# Patient Record
Sex: Male | Born: 1975 | Race: White | Hispanic: No | Marital: Married | Smoking: Never smoker
Health system: Southern US, Community
[De-identification: ages and names within clinical notes are randomized; demographics above are authoritative.]

## PROBLEM LIST (undated history)

## (undated) DIAGNOSIS — N289 Disorder of kidney and ureter, unspecified: Secondary | ICD-10-CM

## (undated) DIAGNOSIS — E669 Obesity, unspecified: Secondary | ICD-10-CM

## (undated) DIAGNOSIS — I10 Essential (primary) hypertension: Secondary | ICD-10-CM

## (undated) HISTORY — DX: Obesity, unspecified: E66.9

---

## 2009-09-22 ENCOUNTER — Ambulatory Visit (HOSPITAL_COMMUNITY): Admission: RE | Admit: 2009-09-22 | Discharge: 2009-09-22 | Payer: Self-pay | Admitting: General Surgery

## 2010-04-24 LAB — DIFFERENTIAL
Basophils Relative: 0 % (ref 0–1)
Eosinophils Absolute: 0.3 10*3/uL (ref 0.0–0.7)
Eosinophils Relative: 3 % (ref 0–5)
Lymphs Abs: 3.1 10*3/uL (ref 0.7–4.0)
Monocytes Relative: 10 % (ref 3–12)

## 2010-04-24 LAB — BASIC METABOLIC PANEL
CO2: 22 mEq/L (ref 19–32)
Chloride: 105 mEq/L (ref 96–112)
Creatinine, Ser: 0.98 mg/dL (ref 0.4–1.5)
GFR calc Af Amer: >60 mL/min (ref >60)
Glucose, Bld: 99 mg/dL (ref 70–99)

## 2010-04-24 LAB — CBC
Hemoglobin: 16 g/dL (ref 13.0–17.0)
MCH: 30.8 pg (ref 26.0–34.0)
MCHC: 34.7 g/dL (ref 30.0–36.0)
MCV: 88.7 fL (ref 78.0–100.0)
Platelets: 216 10*3/uL (ref 150–400)
RBC: 5.2 MIL/uL (ref 4.22–5.81)

## 2010-06-05 ENCOUNTER — Other Ambulatory Visit: Payer: Self-pay | Admitting: Family Medicine

## 2010-06-05 ENCOUNTER — Ambulatory Visit
Admission: RE | Admit: 2010-06-05 | Discharge: 2010-06-05 | Disposition: A | Discharge status: Home / Self Care | Payer: PRIVATE HEALTH INSURANCE | Source: Ambulatory Visit | Attending: Family Medicine | Admitting: Family Medicine

## 2010-06-05 DIAGNOSIS — M25561 Pain in right knee: Secondary | ICD-10-CM

## 2013-12-26 ENCOUNTER — Encounter: Payer: Self-pay | Admitting: Dietician

## 2013-12-26 ENCOUNTER — Encounter: Payer: BC Managed Care – PPO | Attending: Family Medicine | Admitting: Dietician

## 2013-12-26 VITALS — Ht 70.0 in | Wt 275.0 lb

## 2013-12-26 DIAGNOSIS — E669 Obesity, unspecified: Secondary | ICD-10-CM | POA: Diagnosis not present

## 2013-12-26 DIAGNOSIS — Z6839 Body mass index (BMI) 39.0-39.9, adult: Secondary | ICD-10-CM | POA: Diagnosis not present

## 2013-12-26 DIAGNOSIS — Z713 Dietary counseling and surveillance: Secondary | ICD-10-CM | POA: Insufficient documentation

## 2013-12-26 NOTE — Patient Instructions (Addendum)
-  Make healthy choices when picking up something to eat  -Have a protein food with every meal and snack: boiled eggs, nuts and seeds, nut butters, [employer] protein bar, beef jerky  -Breakfast (carb+protein): boiled eggs, peanut butter and jelly sandwich, fruit and nuts  -Stash some snacks at work  -Work on [employer]	[place]   -When cooking, make extras for leftovers  -Steam-in-bag vegetables  -Chicken/egg/tuna salad  -Hard boil several eggs at the beginning of each week  -Drink more water

## 2013-12-26 NOTE — Progress Notes (Signed)
  Medical Nutrition Therapy:  Appt start time: 425 end time:  530.   Assessment:  Primary concerns today: [name] is here today to discuss his diet. 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 He enjoys doing [place] Kwon Do but is not able to go often. He lives with his wife. He does the grocery shopping. His wife has gastroparesis and she follows a low-fiber, bland diet. [place].   Preferred Learning Style:   No preference indicated   Learning Readiness:   contemplating  Ready   MEDICATIONS: see list   DIETARY INTAKE:  Usual eating pattern includes 3 meals and 1-2 snacks per day.  Everyday foods include diet coke.  Avoided foods include milk and all other dairy products.    24-hr recall:  B ( AM): 2 Nutrigrain bars OR 2 [employer] sausage biscuits  Snk ( AM):   L ( PM): peanut butter and jelly sandwich with diet coke Snk ( PM):  D (5 PM): Jimmy John[employer] or pizza or TV dinner Snk (9 PM):   Beverages: diet coke, beer, wine, occasionally liquor  Usual physical activity: tae kwon do, inconsistent  Estimated energy needs: 2000-2200 calories 225-235 g carbohydrates 150-158 g protein 56-58 g fat  Progress Towards Goal(s):  In progress.   Nutritional Diagnosis:  Donald Gates-3.3 Overweight/obesity As related to excessive energy intake, inappropriate food choices, physical inactivity.  As evidenced by BMI 40.    Intervention:  Nutrition counseling provided. Goals: -Make healthy choices when picking up something to eat -Have a protein food with every meal and snack: boiled eggs, nuts and seeds, nut butters, [employer] protein bar, beef jerky -Breakfast (carb+protein): boiled eggs, peanut butter and jelly sandwich, fruit and nuts -Stash some snacks at work -Work on [employer]	[place] meals [place]   -When cooking, make extras for leftovers  -Steam-in-bag vegetables  -Chicken/egg/tuna  salad  -Hard boil several eggs at the beginning of each week -Drink more water  Teaching Method Utilized:  Visual Auditory  Handouts given during visit include:  Fast food options  Healthy fast food options for kids  MyPlate  15g CHO + protein snacks  Barriers to learning/adherence to lifestyle change: schedule  Demonstrated degree of understanding via:  Teach Back   Monitoring/Evaluation:  Dietary intake, exercise, and body weight in 3 month(s).

## 2013-12-27 ENCOUNTER — Encounter: Payer: Self-pay | Admitting: Dietician

## 2014-04-25 ENCOUNTER — Ambulatory Visit: Payer: PRIVATE HEALTH INSURANCE | Admitting: Dietician

## 2014-07-12 ENCOUNTER — Encounter (HOSPITAL_COMMUNITY): Payer: Self-pay | Admitting: Emergency Medicine

## 2014-07-12 ENCOUNTER — Emergency Department (HOSPITAL_COMMUNITY)
Admission: EM | Admit: 2014-07-12 | Discharge: 2014-07-13 | Disposition: A | Discharge status: Home / Self Care | Payer: BC Managed Care – PPO | Attending: Emergency Medicine | Admitting: Emergency Medicine

## 2014-07-12 DIAGNOSIS — I10 Essential (primary) hypertension: Secondary | ICD-10-CM | POA: Insufficient documentation

## 2014-07-12 DIAGNOSIS — R109 Unspecified abdominal pain: Secondary | ICD-10-CM

## 2014-07-12 DIAGNOSIS — E669 Obesity, unspecified: Secondary | ICD-10-CM | POA: Insufficient documentation

## 2014-07-12 DIAGNOSIS — N12 Tubulo-interstitial nephritis, not specified as acute or chronic: Secondary | ICD-10-CM | POA: Insufficient documentation

## 2014-07-12 DIAGNOSIS — Z79899 Other long term (current) drug therapy: Secondary | ICD-10-CM | POA: Insufficient documentation

## 2014-07-12 DIAGNOSIS — G43909 Migraine, unspecified, not intractable, without status migrainosus: Secondary | ICD-10-CM | POA: Insufficient documentation

## 2014-07-12 HISTORY — DX: Disorder of kidney and ureter, unspecified: N28.9

## 2014-07-12 HISTORY — DX: Essential (primary) hypertension: I10

## 2014-07-12 LAB — URINALYSIS, ROUTINE W REFLEX MICROSCOPIC
Bilirubin Urine: NEGATIVE
Glucose, UA: NEGATIVE mg/dL
Ketones, ur: NEGATIVE mg/dL
NITRITE: NEGATIVE
PH: 5.5 (ref 5.0–8.0)
Protein, ur: NEGATIVE mg/dL
Specific Gravity, Urine: 1.017 (ref 1.005–1.030)
Urobilinogen, UA: 0.2 mg/dL (ref 0.0–1.0)

## 2014-07-12 LAB — COMPREHENSIVE METABOLIC PANEL
ALBUMIN: 4.5 g/dL (ref 3.5–5.0)
ALT: 64 U/L — ABNORMAL HIGH (ref 17–63)
AST: 39 U/L (ref 15–41)
Alkaline Phosphatase: 77 U/L (ref 38–126)
Anion gap: 6 (ref 5–15)
BUN: 11 mg/dL (ref 6–20)
CHLORIDE: 106 mmol/L (ref 101–111)
CO2: 21 mmol/L — AB (ref 22–32)
CREATININE: 0.92 mg/dL (ref 0.61–1.24)
Calcium: 8.7 mg/dL — ABNORMAL LOW (ref 8.9–10.3)
Glucose, Bld: 104 mg/dL — ABNORMAL HIGH (ref 65–99)
Potassium: 3.9 mmol/L (ref 3.5–5.1)
Sodium: 133 mmol/L — ABNORMAL LOW (ref 135–145)
TOTAL PROTEIN: 8.1 g/dL (ref 6.5–8.1)
Total Bilirubin: 0.6 mg/dL (ref 0.3–1.2)

## 2014-07-12 LAB — CBC
HEMATOCRIT: 44.2 % (ref 39.0–52.0)
Hemoglobin: 15.2 g/dL (ref 13.0–17.0)
MCH: 30.5 pg (ref 26.0–34.0)
MCHC: 34.4 g/dL (ref 30.0–36.0)
MCV: 88.8 fL (ref 78.0–100.0)
PLATELETS: 248 10*3/uL (ref 150–400)
RBC: 4.98 MIL/uL (ref 4.22–5.81)
RDW: 12.4 % (ref 11.5–15.5)
WBC: 17.1 10*3/uL — ABNORMAL HIGH (ref 4.0–10.5)

## 2014-07-12 LAB — URINE MICROSCOPIC-ADD ON

## 2014-07-12 NOTE — ED Notes (Signed)
Pt c/o R flank pain. Pt has hx of kidney stones. Pt sts "I think I passed a stone earlier today." Pt c/o fever. Pt temp 98.9 at this time. Pt A&Ox4 and ambulatory. Pt c/o urinary frequency. Pt was seen at the walk in clinic and told to come here for rule out of pyelo nephritis. Per clinic, WBC elevated and there was blood in urine.

## 2014-07-13 ENCOUNTER — Emergency Department (HOSPITAL_COMMUNITY): Payer: BC Managed Care – PPO

## 2014-07-13 MED ORDER — DIPHENHYDRAMINE HCL 50 MG/ML IJ SOLN
25.0000 mg | Freq: Once | INTRAMUSCULAR | Status: AC
  Administered 2014-07-13: 25 mg via INTRAVENOUS
  Filled 2014-07-13: qty 1

## 2014-07-13 MED ORDER — KETOROLAC TROMETHAMINE 30 MG/ML IJ SOLN
30.0000 mg | Freq: Once | INTRAMUSCULAR | Status: AC
  Administered 2014-07-13: 30 mg via INTRAVENOUS
  Filled 2014-07-13: qty 1

## 2014-07-13 MED ORDER — HYDROCODONE-ACETAMINOPHEN 5-325 MG PO TABS: 1.0000 | ORAL_TABLET | ORAL | Status: DC | PRN

## 2014-07-13 MED ORDER — ACETAMINOPHEN 325 MG PO TABS
650.0000 mg | ORAL_TABLET | Freq: Once | ORAL | Status: AC
  Administered 2014-07-13: 650 mg via ORAL
  Filled 2014-07-13: qty 2

## 2014-07-13 MED ORDER — DEXTROSE 5 % IV SOLN
1.0000 g | Freq: Once | INTRAVENOUS | Status: AC
  Administered 2014-07-13: 1 g via INTRAVENOUS
  Filled 2014-07-13: qty 10

## 2014-07-13 MED ORDER — CEPHALEXIN 500 MG PO CAPS: 500.0000 mg | ORAL_CAPSULE | Freq: Two times a day (BID) | ORAL | Status: DC

## 2014-07-13 MED ORDER — METOCLOPRAMIDE HCL 5 MG/ML IJ SOLN
10.0000 mg | Freq: Once | INTRAMUSCULAR | Status: AC
  Administered 2014-07-13: 10 mg via INTRAVENOUS
  Filled 2014-07-13: qty 2

## 2014-07-13 MED ORDER — ONDANSETRON 8 MG PO TBDP: 8.0000 mg | ORAL_TABLET | Freq: Three times a day (TID) | ORAL | Status: DC | PRN

## 2014-07-13 NOTE — Discharge Instructions (Signed)
°  Take antibiotics as prescribed.  Take pain and nausea medication as needed.  Return to the ER if you are unable to keep down the antibiotics, if you have continued fever, vomiting despite the medication, or other concerns.  Follow up with your doctor for recheck in 3-5 days.  If you do not have a doctor, follow up with one of the numbers listed    PYELONEPHRITIS  PYELONEPHRITIS: You have been diagnosed with pyelonephritis, also known as a kidney infection.  Pyelonephritis is a bacterial infection in your kidneys. Symptoms include fever, chills, nausea and vomiting, back pain on one or both sides, and sometimes difficulty urinating. You may also feel very weak. The diagnosis is made based on your symptoms and a test of your urine.  Pyelonephritis most commonly occurs when a lower urinary tract infection (bladder infection) ascends (climbs up) the urinary tract and gets into the kidney. Early treatment of a bladder infection is important to prevent pyelonephritis and the possibility of kidney damage.  Pyelonephritis is treated with antibiotics, pain and fever medications, and fluids. Some patients require an "IV" if they have nausea or vomiting and cannot keep down the antibiotics, or if they arent improving after 2-3 days of oral (by mouth) medications.  Most patients do not need to be admitted to the hospital for pyelonephritis.  A few patients who dont do well with the oral (by mouth) antibiotics or become sicker despite treatment may need to return to be admitted to the hospital.  YOU SHOULD SEEK MEDICAL ATTENTION IMMEDIATELY, EITHER HERE OR AT THE NEAREST EMERGENCY DEPARTMENT, IF ANY OF THE FOLLOWING OCCURS:      Failure to improve after 2-3 days of antibiotics.     You develop nausea or vomiting and are unable to keep down medications or fluids.     Increased weakness or lightheadedness.     You develop any progressive or worsening symptoms or any other concerns.        If  you develop symptoms of Shortness of Breath, Chest Pain, Swelling of lips, mouth or tongue or if your condition becomes worse with any new symptoms, see your doctor or return to the Emergency Department for immediate care. Emergency services are not intended to be a substitute for comprehensive medical attention.  Please contact your doctor for follow up if not improving as expected.   Call your doctor in 5-7 days or as directed if there is no improvement.

## 2014-07-13 NOTE — ED Notes (Signed)
Patient transported to CT 

## 2014-07-13 NOTE — ED Provider Notes (Signed)
CSN: [id number]     Arrival date & time 07/12/14  2108 History   First MD Initiated Contact with Patient 07/13/14 0133     Chief Complaint  Patient presents with  . Flank Pain     (Consider location/radiation/quality/duration/timing/severity/associated sxs/prior Treatment) HPI 39 year old male presents to emergency room with complaint of right flank pain, fever, urinary frequency.  Patient had been feeling well, was suddenly became chilled after work today.  Patient urinated and noticed a red colored object in his urine.  He took a long hot shower and then went to the urgent care.  There he was noted to have a fever of 101, and urinalysis concerning for infection.  He was sent here for further evaluation.  Patient reports kidney stone 15 years ago, none since.  He reports that he has had no nausea or vomiting.  His fever has improved. Past Medical History  Diagnosis Date  . Obesity   . Renal disorder   . Hypertension    History reviewed. No pertinent past surgical history. Family History  Problem Relation Age of Onset  . Cancer Mother   . Cancer Father   . Hypertension Other   . Diabetes Other   . Obesity Other   . Heart attack Other    History  Substance Use Topics  . Smoking status: Never Smoker   . Smokeless tobacco: Not on file  . Alcohol Use: Yes    Review of Systems  Genitourinary: Positive for urgency, frequency, hematuria and flank pain.      Allergies  Review of patient's allergies indicates no known allergies.  Home Medications   Prior to Admission medications   Medication Sig Start Date End Date Taking? Authorizing Provider  ibuprofen (ADVIL,MOTRIN) 200 MG tablet Take 400-800 mg by mouth every 6 (six) hours as needed for moderate pain.   Yes Historical Provider, MD  propranolol (INDERAL) 20 MG tablet Take 40 mg by mouth daily.    Yes Historical Provider, MD  cephALEXin (KEFLEX) 500 MG capsule Take 1 capsule (500 mg total) by mouth 2 (two) times daily.  07/13/14   Marisa Severin, MD  HYDROcodone-acetaminophen (NORCO/VICODIN) 5-325 MG per tablet Take 1-2 tablets by mouth every 4 (four) hours as needed. 07/13/14   Marisa Severin, MD  ondansetron (ZOFRAN ODT) 8 MG disintegrating tablet Take 1 tablet (8 mg total) by mouth every 8 (eight) hours as needed for nausea or vomiting. 07/13/14   Marisa Severin, MD   BP 138/75 mmHg  Pulse 76  Temp(Src) 98.9 F (37.2 C) (Oral)  Resp 18  SpO2 98% Physical Exam  Constitutional: He is oriented to person, place, and time. He appears well-developed and well-nourished.  HENT:  Head: Normocephalic and atraumatic.  Nose: Nose normal.  Mouth/Throat: Oropharynx is clear and moist.  Eyes: Conjunctivae and EOM are normal. Pupils are equal, round, and reactive to light.  Neck: Normal range of motion. Neck supple. No JVD present. No tracheal deviation present. No thyromegaly present.  Cardiovascular: Normal rate, regular rhythm, normal heart sounds and intact distal pulses.  Exam reveals no gallop and no friction rub.   No murmur heard. Pulmonary/Chest: Effort normal and breath sounds normal. No stridor. No respiratory distress. He has no wheezes. He has no rales. He exhibits no tenderness.  Abdominal: Soft. Bowel sounds are normal. He exhibits no distension and no mass. There is no tenderness. There is no rebound and no guarding.  No CVA tenderness  Musculoskeletal: Normal range of motion. He exhibits no edema  or tenderness.  Lymphadenopathy:    He has no cervical adenopathy.  Neurological: He is alert and oriented to person, place, and time. He displays normal reflexes. He exhibits normal muscle tone. Coordination normal.  Skin: Skin is warm and dry. No rash noted. No erythema. No pallor.  Psychiatric: He has a normal mood and affect. His behavior is normal. Judgment and thought content normal.  Nursing note and vitals reviewed.   ED Course  Procedures (including critical care time) Labs Review Labs Reviewed  URINALYSIS,  ROUTINE W REFLEX MICROSCOPIC (NOT AT [facility]) - Abnormal; Notable for the following:    APPearance CLOUDY (*)    Hgb urine dipstick MODERATE (*)    Leukocytes, UA MODERATE (*)    All other components within normal limits  CBC - Abnormal; Notable for the following:    WBC 17.1 (*)    All other components within normal limits  COMPREHENSIVE METABOLIC PANEL - Abnormal; Notable for the following:    Sodium 133 (*)    CO2 21 (*)    Glucose, Bld 104 (*)    Calcium 8.7 (*)    ALT 64 (*)    All other components within normal limits  URINE MICROSCOPIC-ADD ON - Abnormal; Notable for the following:    Squamous Epithelial / LPF FEW (*)    Bacteria, UA FEW (*)    All other components within normal limits  URINE CULTURE    Imaging Review Ct Renal Stone Study  07/13/2014   CLINICAL DATA:  Initial valuation for acute right flank pain.  EXAM: CT ABDOMEN AND PELVIS WITHOUT CONTRAST  TECHNIQUE: Multidetector CT imaging of the abdomen and pelvis was performed following the standard protocol without IV contrast.  COMPARISON:  None available.  FINDINGS: Visualized lung bases are clear.  No pleural or pericardial fusion.  Diffuse hypoattenuation of the liver is consistent with steatosis. Focal fatty sparing present adjacent to the gallbladder fossa. Liver is otherwise unremarkable. Gallbladder within normal limits. No biliary dilatation. Spleen, adrenal glands, and pancreas demonstrate a in normal unenhanced appearance.  Right kidney is unremarkable without evidence of nephrolithiasis or hydronephrosis. No radiopaque calculi seen along the course of the right renal collecting system. No right-sided hydroureter.  On the left, there is a punctate 2 mm nonobstructive stone within the interpolar left kidney. No other radiopaque calculi identified. No hydronephrosis. No stones seen along the course of the left renal collecting system. There is no left-sided hydroureter.  Stomach within normal limits. No evidence for bowel  obstruction. Appendix is well visualized in the right lower quadrant and is of normal caliber and appearance without associated inflammatory changes to suggest acute appendicitis. No abnormal wall thickening or inflammatory fat stranding seen about the bowels.  Bladder within normal limits.  Prostate unremarkable.  Small right small fat containing right inguinal hernia noted. No free air or fluid. No pathologically enlarged intra-abdominal or pelvic lymph nodes identified.  No acute osseous abnormality. No worrisome lytic or blastic osseous lesion.  IMPRESSION: 1. No evidence for right-sided nephrolithiasis or obstructive uropathy to explain right-sided flank pain. 2. Punctate 2 mm nonobstructive left renal calculus. No evidence for left-sided obstructive uropathy. 3. No other acute intra-abdominal or pelvic process. 4. Normal appendix. 5. Small fat containing right inguinal hernia. 6. Hepatic steatosis.   Electronically Signed   By: Rise MuBenjamin  McClintock M.D.   On: 07/13/2014 04:19     EKG Interpretation None      MDM   Final diagnoses:  Right flank pain  Pyelonephritis    39 year old male with acute onset of fever and right flank pain.  Concern for infected stone.  Plan for CT abdomen pelvis without contrast.  Patient without but if headache has history of migraines.  We'll give migraine cocktail.  Will start antibodies to cover for pyelonephritis.   Patient's headache has resolved.  No stones seen on the right.  He does have a small stone in the left kidney.currently causing any issues.  Patient has tolerated by mouth fluids here.  He is stable for discharge home.  He was given precautions for return.    Marisa Severin, MD 07/13/14 [id number]

## 2014-07-16 LAB — URINE CULTURE

## 2014-07-17 ENCOUNTER — Telehealth (HOSPITAL_BASED_OUTPATIENT_CLINIC_OR_DEPARTMENT_OTHER): Payer: Self-pay | Admitting: Emergency Medicine

## 2014-07-17 NOTE — Telephone Encounter (Signed)
Post ED Visit - Positive Culture Follow-up  Culture report reviewed by antimicrobial stewardship pharmacist: []  [name], Pharm.D., BCPS []  [name], Pharm.D., BCPS [x]  [name], Pharm.D., BCPS []  [name], [place]arm.D., BCPS, AAHIVP []  [name], Pharm.D., BCPS, AAHIVP []  [name], [address] BoulevardPharm.D., BCPS  Positive urine culture Treated with cephalexin, organism sensitive to the same and no further patient follow-up is required at this time.  [name], [name] 07/17/2014, 9:26 AM

## 2015-09-25 ENCOUNTER — Other Ambulatory Visit: Payer: Self-pay | Admitting: Family Medicine

## 2015-09-25 DIAGNOSIS — R740 Nonspecific elevation of levels of transaminase and lactic acid dehydrogenase [LDH]: Principal | ICD-10-CM

## 2015-09-25 DIAGNOSIS — R7401 Elevation of levels of liver transaminase levels: Secondary | ICD-10-CM

## 2015-10-07 ENCOUNTER — Other Ambulatory Visit: Payer: BC Managed Care – PPO

## 2015-10-14 ENCOUNTER — Other Ambulatory Visit: Payer: BC Managed Care – PPO

## 2015-10-15 ENCOUNTER — Ambulatory Visit
Admission: RE | Admit: 2015-10-15 | Discharge: 2015-10-15 | Disposition: A | Discharge status: Home / Self Care | Payer: BC Managed Care – PPO | Source: Ambulatory Visit | Attending: Family Medicine | Admitting: Family Medicine

## 2015-10-15 DIAGNOSIS — R7401 Elevation of levels of liver transaminase levels: Secondary | ICD-10-CM

## 2015-10-15 DIAGNOSIS — R740 Nonspecific elevation of levels of transaminase and lactic acid dehydrogenase [LDH]: Principal | ICD-10-CM

## 2015-10-17 ENCOUNTER — Other Ambulatory Visit: Payer: BC Managed Care – PPO

## 2016-09-30 ENCOUNTER — Other Ambulatory Visit: Payer: Self-pay | Admitting: Dermatology

## 2016-12-11 ENCOUNTER — Ambulatory Visit (HOSPITAL_COMMUNITY)
Admission: EM | Admit: 2016-12-11 | Discharge: 2016-12-11 | Disposition: A | Discharge status: Home / Self Care | Payer: BC Managed Care – PPO | Attending: Family Medicine | Admitting: Family Medicine

## 2016-12-11 ENCOUNTER — Ambulatory Visit (INDEPENDENT_AMBULATORY_CARE_PROVIDER_SITE_OTHER): Payer: BC Managed Care – PPO

## 2016-12-11 ENCOUNTER — Encounter (HOSPITAL_COMMUNITY): Payer: Self-pay | Admitting: Emergency Medicine

## 2016-12-11 DIAGNOSIS — M549 Dorsalgia, unspecified: Secondary | ICD-10-CM | POA: Diagnosis not present

## 2016-12-11 DIAGNOSIS — W11XXXA Fall on and from ladder, initial encounter: Secondary | ICD-10-CM

## 2016-12-11 MED ORDER — CYCLOBENZAPRINE HCL 10 MG PO TABS: 10.0000 mg | ORAL_TABLET | Freq: Three times a day (TID) | ORAL | 0 refills | Status: DC

## 2016-12-11 MED ORDER — DICLOFENAC SODIUM 75 MG PO TBEC: 75.0000 mg | DELAYED_RELEASE_TABLET | Freq: Two times a day (BID) | ORAL | 0 refills | Status: DC

## 2016-12-11 NOTE — ED Triage Notes (Signed)
Patient fell approx 12 feet off a ladder.  Patient was trimming trees.  Landed on dirt.  No loc.  Patient says he remembers the fall.  Patient says mid back pain, occassionally pain into center chest.  Denies numbness or tingling of extremities.

## 2016-12-11 NOTE — Discharge Instructions (Signed)

## 2016-12-11 NOTE — ED Provider Notes (Signed)
[facility]   [id number] 12/11/16 Arrival Time: 1635  ASSESSMENT & PLAN:  1. Fall from ladder, initial encounter   2. Mid back pain     Meds ordered this encounter  Medications  . cyclobenzaprine (FLEXERIL) 10 MG tablet    Sig: Take 1 tablet (10 mg total) by mouth 3 (three) times daily.    Dispense:  20 tablet    Refill:  0  . diclofenac (VOLTAREN) 75 MG EC tablet    Sig: Take 1 tablet (75 mg total) by mouth 2 (two) times daily.    Dispense:  14 tablet    Refill:  0    Reviewed expectations re: course of current medical issues. Questions answered. Outlined signs and symptoms indicating need for more acute intervention. Patient verbalized understanding. After Visit Summary given.   SUBJECTIVE:  Donald Gates is a 41 y.o. male who presents with complaint of mid back discomfort. Onset abrupt beginning today after he reports falling from a ladder while cutting tree limbs. Questions height of 10-12 feet. "Landed and rolled on the ground." Landed on dirt. Gradual onset of mid back discomfort. Normal breathing without SOB. No n/v. Some discomfort in center of chest; transient and now resolved. No extremity sensation changes or weakness. No specific aggravating or alleviating factors reported. OTC ibuprofen with mild help. Normal bladder habits.  ROS: As per HPI.   OBJECTIVE:  Vitals:   12/11/16 1700  BP: 114/71  Pulse: 69  Resp: 19  Temp: 98.5 F (36.9 C)  TempSrc: Oral  SpO2: 96%    General appearance: alert; no distress Lungs: CTAB Abdomen: soft, non-tender; bowel sounds normal; no masses or organomegaly; no guarding or rebound tenderness Back: poorly localized tenderness over mid and lateral back; FROM at hips Extremities: no cyanosis or edema; symmetrical with no gross deformities Skin: warm and dry Neurologic: normal gait; normal symmetric reflexes; normal LE strength and sensation Psychological: alert and cooperative; normal mood and  affect  Imaging: Dg Chest 1 View  Result Date: 12/11/2016 CLINICAL DATA:  Pt fell off a ladder x 10 foot drop s hrs ago. Busing and abrasions around t6-10 area. Pt denies any breathing pain or rib pain. Pain in T spine increase with ab and adduction. No previous injury to spine, no known lung conditions. Controlled hypertension. EXAM: CHEST 1 VIEW COMPARISON:  None. FINDINGS: Cardiac silhouette is normal in size. No mediastinal or hilar masses or evidence of adenopathy. Clear lungs.  No pleural effusion or pneumothorax. Skeletal structures are intact. IMPRESSION: No active disease. Electronically Signed   By: Amie Portland M.D.   On: 12/11/2016 17:58   Dg Thoracic Spine 2 View  Result Date: 12/11/2016 CLINICAL DATA:  Initial evaluation for acute trauma, fall. EXAM: THORACIC SPINE 2 VIEWS COMPARISON:  None. FINDINGS: There is no evidence of thoracic spine fracture. Alignment is normal. No other significant bone abnormalities are identified. IMPRESSION: No radiographic evidence for acute abnormality within the thoracic spine. Electronically Signed   By: Rise Mu M.D.   On: 12/11/2016 17:59    No Known Allergies  Past Medical History:  Diagnosis Date  . Hypertension   . Obesity   . Renal disorder    Social History   Social History  . Marital status: Married    Spouse name: N/A  . Number of children: N/A  . Years of education: N/A   Occupational History  . Not on file.   Social History Main Topics  . Smoking status: Never Smoker  .  Smokeless tobacco: Not on file  . Alcohol use Yes  . Drug use: Unknown  . Sexual activity: Not on file   Other Topics Concern  . Not on file   Social History Narrative  . No narrative on file   Family History  Problem Relation Age of Onset  . Cancer Mother   . Cancer Father   . Hypertension Other   . Diabetes Other   . Obesity Other   . Heart attack Other    No past surgical history on file.   Mardella LaymanHagler, Paulett Kaufhold, MD 12/13/16  1128

## 2016-12-25 DIAGNOSIS — M542 Cervicalgia: Secondary | ICD-10-CM | POA: Insufficient documentation

## 2017-02-14 DIAGNOSIS — M546 Pain in thoracic spine: Secondary | ICD-10-CM | POA: Insufficient documentation

## 2017-02-14 DIAGNOSIS — M75100 Unspecified rotator cuff tear or rupture of unspecified shoulder, not specified as traumatic: Secondary | ICD-10-CM | POA: Insufficient documentation

## 2017-08-15 DIAGNOSIS — M5136 Other intervertebral disc degeneration, lumbar region: Secondary | ICD-10-CM | POA: Insufficient documentation

## 2018-09-22 DIAGNOSIS — G5603 Carpal tunnel syndrome, bilateral upper limbs: Secondary | ICD-10-CM | POA: Insufficient documentation

## 2019-01-24 ENCOUNTER — Ambulatory Visit: Payer: BC Managed Care – PPO | Attending: Internal Medicine

## 2019-01-24 ENCOUNTER — Other Ambulatory Visit: Payer: Self-pay

## 2019-01-24 DIAGNOSIS — Z20822 Contact with and (suspected) exposure to covid-19: Secondary | ICD-10-CM

## 2019-01-27 LAB — NOVEL CORONAVIRUS, NAA: SARS-CoV-2, NAA: NOT DETECTED

## 2019-02-20 IMAGING — DX DG THORACIC SPINE 2V
2 series · 2 of 2 positions shown · non-contrast
Comparison: None.

CLINICAL DATA: Initial evaluation for acute trauma, fall.

EXAM:
THORACIC SPINE 2 VIEWS

[t-spine ap]
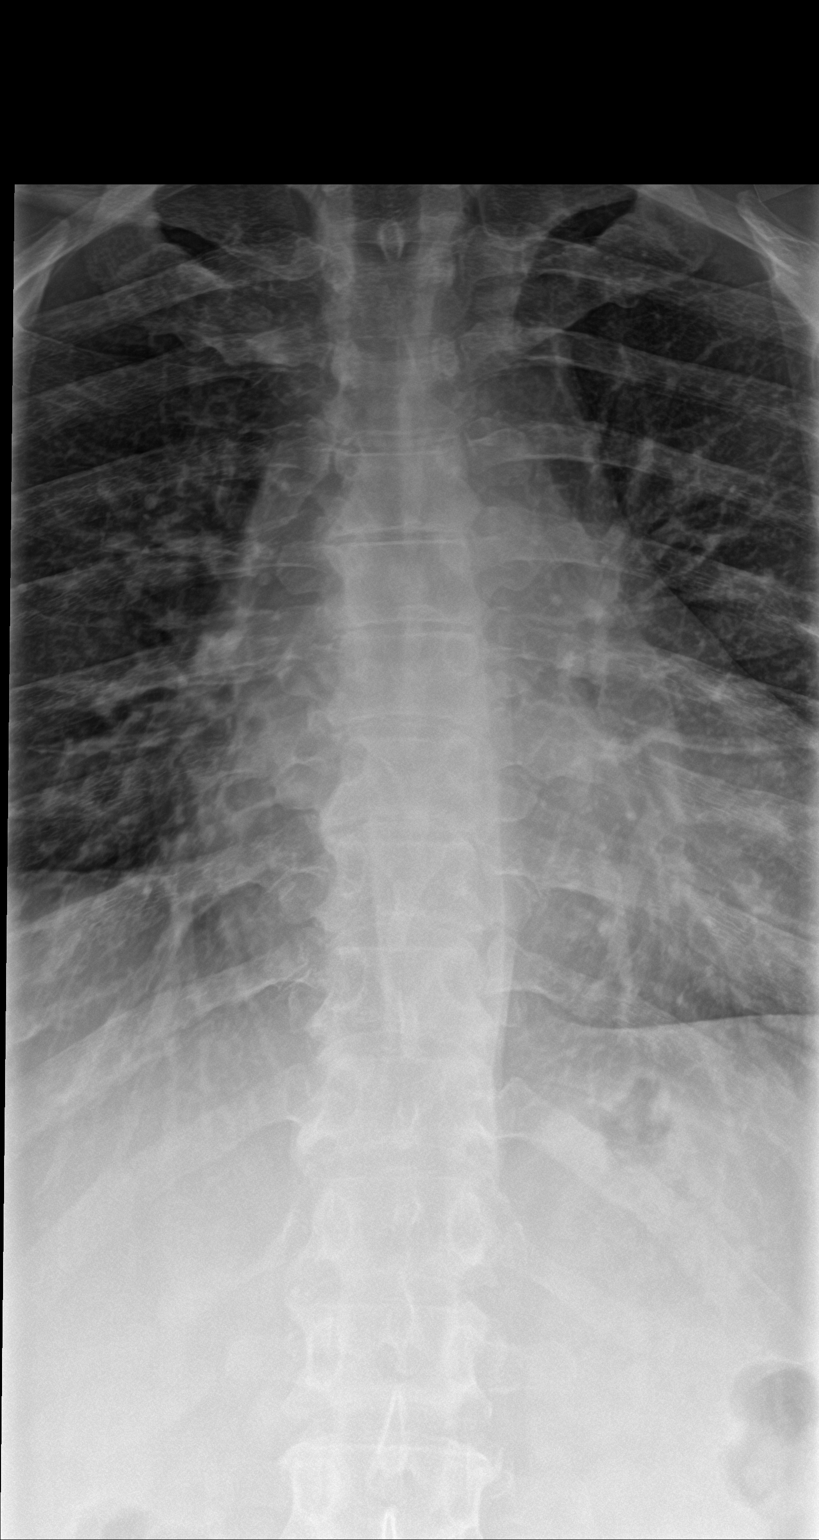

[t-spine lat]
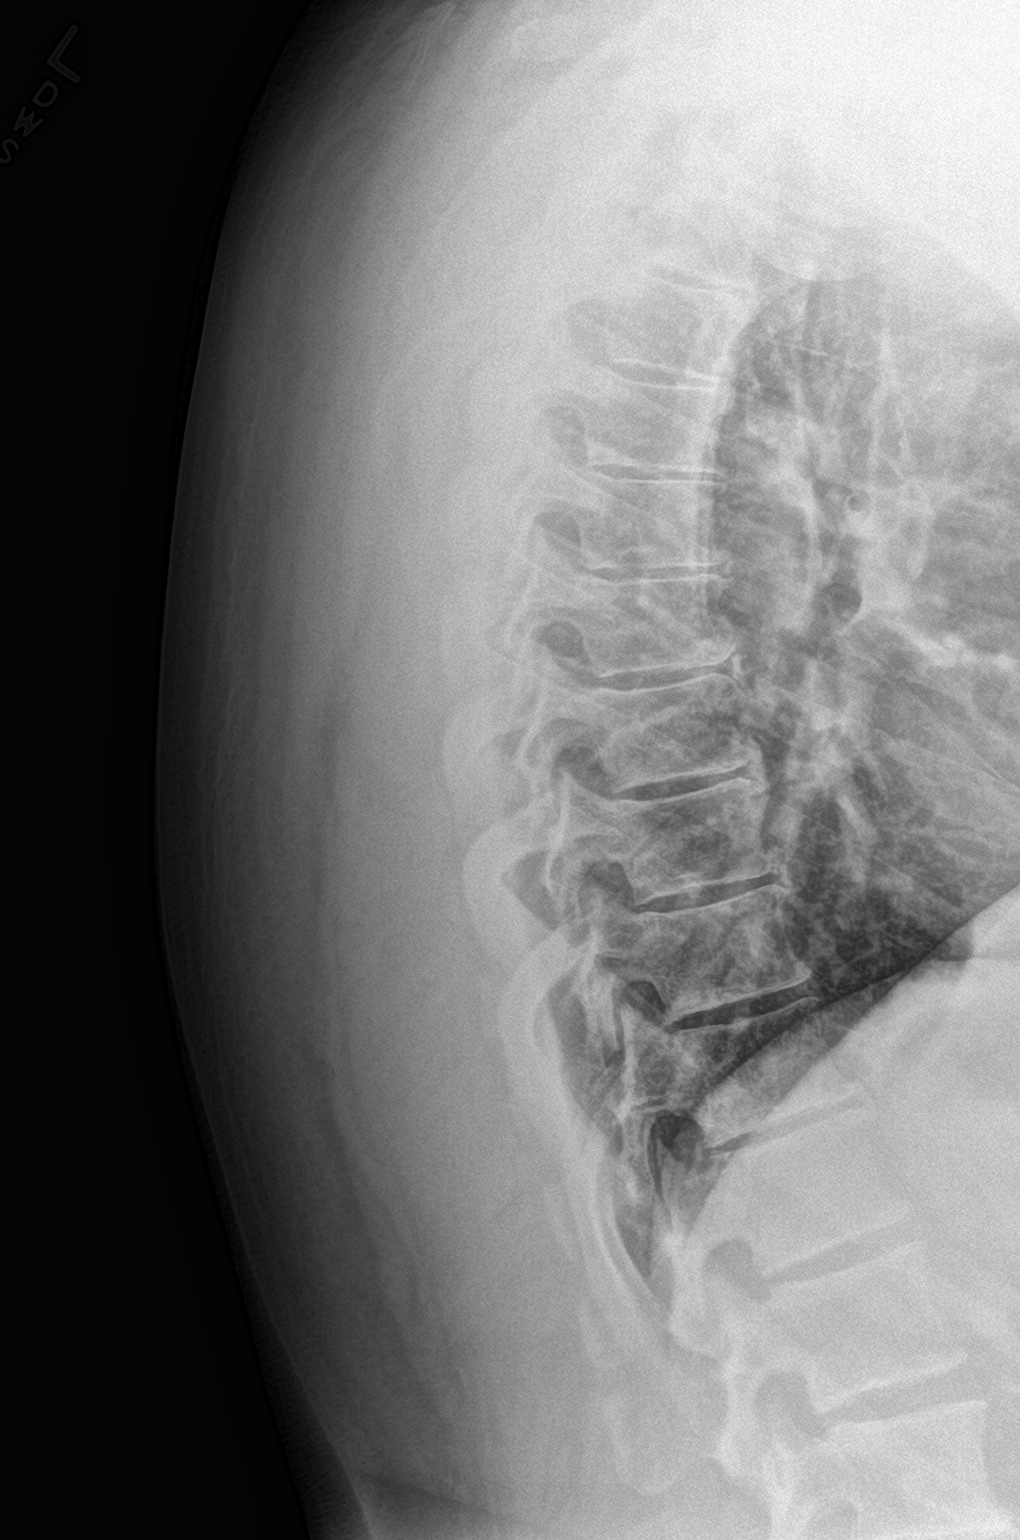

[2 of 2 positions shown; findings below may reference images not displayed]

FINDINGS: There is no evidence of thoracic spine fracture. Alignment is
normal. No other significant bone abnormalities are identified.
IMPRESSION: No radiographic evidence for acute abnormality within the thoracic
spine.

## 2019-04-07 ENCOUNTER — Ambulatory Visit: Payer: BC Managed Care – PPO | Attending: Internal Medicine

## 2019-04-07 DIAGNOSIS — Z23 Encounter for immunization: Secondary | ICD-10-CM

## 2019-04-07 NOTE — Progress Notes (Signed)
   Covid-19 Vaccination Clinic  Name:  Donald Gates    MRN: [id number] DOB: 02-01-1976  04/07/2019  [name] was observed post Covid-19 immunization for 15 minutes without incidence. He was provided with Vaccine Information Sheet and instruction to access the V-Safe system.   [name] was instructed to call 911 with any severe reactions post vaccine: Marland Kitchen Difficulty breathing  . Swelling of your face and throat  . A fast heartbeat  . A bad rash all over your body  . Dizziness and weakness    Immunizations Administered    Name Date Dose VIS Date Route   Pfizer COVID-19 Vaccine 04/07/2019 11:20 AM 0.3 mL 01/19/2019 Intramuscular   Manufacturer: [employer], [employer]   Lot: [id number]   NDC: [address]-4

## 2019-04-28 ENCOUNTER — Ambulatory Visit: Payer: BC Managed Care – PPO | Attending: Internal Medicine

## 2019-04-28 DIAGNOSIS — Z23 Encounter for immunization: Secondary | ICD-10-CM

## 2019-04-28 NOTE — Progress Notes (Signed)
   Covid-19 Vaccination Clinic  Name:  Donald Gates    MRN: [id number] DOB: 15-Apr-1975  04/28/2019  [name] was observed post Covid-19 immunization for 15 minutes without incident. He was provided with Vaccine Information Sheet and instruction to access the V-Safe system.   [name] was instructed to call 911 with any severe reactions post vaccine: Marland Kitchen Difficulty breathing  . Swelling of face and throat  . A fast heartbeat  . A bad rash all over body  . Dizziness and weakness   Immunizations Administered    Name Date Dose VIS Date Route   Pfizer COVID-19 Vaccine 04/28/2019 11:13 AM 0.3 mL 01/19/2019 Intramuscular   Manufacturer: [employer], [employer]   Lot: [id number]   NDC: [address]-9

## 2019-05-02 ENCOUNTER — Ambulatory Visit: Payer: BC Managed Care – PPO

## 2019-08-20 ENCOUNTER — Other Ambulatory Visit: Payer: Self-pay

## 2019-08-20 ENCOUNTER — Encounter: Payer: Self-pay | Admitting: Family Medicine

## 2019-08-20 ENCOUNTER — Ambulatory Visit: Payer: BC Managed Care – PPO | Admitting: Family Medicine

## 2019-08-20 DIAGNOSIS — M542 Cervicalgia: Secondary | ICD-10-CM | POA: Diagnosis not present

## 2019-08-20 DIAGNOSIS — M25512 Pain in left shoulder: Secondary | ICD-10-CM | POA: Diagnosis not present

## 2019-08-20 MED ORDER — MELOXICAM 15 MG PO TABS: 7.5000 mg | ORAL_TABLET | Freq: Every day | ORAL | 6 refills | Status: DC | PRN

## 2019-08-20 MED ORDER — TIZANIDINE HCL 2 MG PO TABS: 2.0000 mg | ORAL_TABLET | Freq: Four times a day (QID) | ORAL | 1 refills | Status: DC | PRN

## 2019-08-20 NOTE — Progress Notes (Signed)
I saw and examined the patient with Dr. Marga Hoots and agree with assessment and plan as outlined.    Left shoulder and arm pain with hand intrinsic muscle weakness, concerning for cervical radiculopathy.  Multiple myofascial trigger points as well.  Will try meds, physical therapy.  Consider nerve studies if symptoms persist.

## 2019-08-20 NOTE — Progress Notes (Signed)
Office Visit Note   Patient: Donald Gates           Date of Birth: 1975-02-20           MRN: [id number] Visit Date: 08/20/2019 Requested by: No referring provider defined for this encounter. PCP: Patient, No Pcp Per  Subjective: Chief Complaint  Patient presents with  . Left Shoulder - Pain    Pain x a few months. Went to [employer] - was not happy with what he was told. NKI. Pain on top of shoulder and down into the deltoid region. Pain with certain movements. Would usually sleep on left side - cannot, due to pain.    HPI: 44yo M presenting with L shoulder pain x 'several months,' which he states is most bothersome with overhead activity and sleeping on his L side. Sm denies any specific injury, though does state he has been 'trying to get back into the gym and get back to doing the right things,' and feels that this injury is holding him back. Pain will occasionally travel up into his L trapezius as well, but does not radiate into his hand. Pain is primarily focused in his L deltoid, though he says it is not particularly painful to the touch. He has tried massage, and has bought both a theragun and a theracane to try to improve his symptoms on his own- with little improvement.  He states he is frustrated as others has told him it'll 'work itself out' and is just isn't going away. He does endorse feeling weaker in his L hand, and occasionally his hand will 'get the tingles,' but states he has been told he has carpal tunnel in the past.               ROS:   All other systems were reviewed and are negative.  Objective: Vital Signs: There were no vitals taken for this visit.  Physical Exam:  General:  Alert and oriented, in no acute distress. Cards: Appears well perfused. Pulm:  Breathing unlabored. Psy:  Normal mood, congruent affect. Skin:  L shoulder with no bruising, swelling, or other deformities.   MSK: LEFT SHOULDER:  No obvious muscular asymmetry. Scapular movement smooth  thorughout ROM. Full AB/ADuction, though does endorse pain in extremes of Abduction.  Empty Can reproduces minimal pain. No pain with full can.   Belly Press: Negative No pain with resisted internal or extremal rotation.  O'Brien/Speeds: Negative Crank: Negative Biceps Load II: Negative No pain with palpation of Biceps tendon. Yergason's Negative.  Sperling negative.  TRIGGER POINTS appreciated in L scalenes, as well as throughout L superior trapezius, rhomboids, and in posterior aspect of deltoid.   NEURO: L hand with reduced grip strength when compared to right. 4/5 strength with finger Abduction (5/5 on right).   Imaging: No results found.  Assessment & Plan: 44yo M with several months of insidious onset L shoulder pain. Examination with several trigger points throughout L upper back/shoulder, neck, as well as with reduced strength in L hand (complicated by Hx of carpal tunnel).  -Recommend physical therapy to help address trigger points, consider cervical exercises - Will prove NSAIDs, muscle relaxants for symptomatic control - If no improvement with plan, RTC in 3-4 wks for consideration of EMG for evaluation of possible cervical radiculopathy as a source of symptoms - Patient had no further questions or concerns.     Procedures: No procedures performed  No notes on file     PMFS History: Patient Active  Problem List   Diagnosis Date Noted  . Bilateral carpal tunnel syndrome 09/22/2018  . Degeneration of lumbar intervertebral disc 08/15/2017  . Pain in thoracic spine 02/14/2017  . Rotator cuff syndrome 02/14/2017  . Neck pain 12/25/2016   Past Medical History:  Diagnosis Date  . Hypertension   . Obesity   . Renal disorder     Family History  Problem Relation Age of Onset  . Cancer Mother   . Cancer Father   . Hypertension Other   . Diabetes Other   . Obesity Other   . Heart attack Other     History reviewed. No pertinent surgical history. Social History    Occupational History  . Not on file  Tobacco Use  . Smoking status: Never Smoker  Substance and Sexual Activity  . Alcohol use: Yes  . Drug use: Not on file  . Sexual activity: Not on file

## 2020-05-14 ENCOUNTER — Ambulatory Visit: Payer: Self-pay

## 2020-05-14 ENCOUNTER — Encounter: Payer: Self-pay | Admitting: Orthopaedic Surgery

## 2020-05-14 ENCOUNTER — Ambulatory Visit: Payer: BC Managed Care – PPO | Admitting: Orthopaedic Surgery

## 2020-05-14 ENCOUNTER — Other Ambulatory Visit: Payer: Self-pay

## 2020-05-14 DIAGNOSIS — M25521 Pain in right elbow: Secondary | ICD-10-CM | POA: Diagnosis not present

## 2020-05-14 NOTE — Progress Notes (Signed)
Office Visit Note   Patient: Donald Gates           Date of Birth: December 10, 1975           MRN: [id number] Visit Date: 05/14/2020              Requested by: No referring provider defined for this encounter. PCP: Patient, No Pcp Per (Inactive)   Assessment & Plan: Visit Diagnoses:  1. Pain in right elbow     Plan: Impression is chronic right elbow lateral epicondylitis.  Given the chronicity of the condition we will need to obtain MRI to rule out structural abnormalities first before we could consider cortisone injection.  Work note was provided today.  Follow-up after the MRI.  Follow-Up Instructions: Return if symptoms worsen or fail to improve.   Orders:  Orders Placed This Encounter  Procedures  . XR Elbow Complete Right (3+View)   No orders of the defined types were placed in this encounter.     Procedures: No procedures performed   Clinical Data: No additional findings.   Subjective: Chief Complaint  Patient presents with  . Right Elbow - Pain    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  Denies any numbness and tingling.  He has been treating this with physical therapy at [facility] and dry needling.  He takes Advil and tizanidine as well.  He feels weakness with grip.  Denies any radiating burning pain.  He states that he may have originally injured it a year ago when he was doing preacher curls at the gym.  He is limited due to the pain.   Review of Systems  Constitutional: Negative.   All other systems reviewed and are negative.    Objective: Vital Signs: There were no vitals taken for this visit.  Physical Exam Vitals and nursing note reviewed.  Constitutional:      Appearance: He is well-developed.  HENT:     Head: Normocephalic and atraumatic.  Eyes:     Pupils: Pupils are equal, round, and reactive to  light.  Pulmonary:     Effort: Pulmonary effort is normal.  Abdominal:     Palpations: Abdomen is soft.  Musculoskeletal:        General: Normal range of motion.     Cervical back: Neck supple.  Skin:    General: Skin is warm.  Neurological:     Mental Status: He is alert and oriented to person, place, and time.  Psychiatric:        Behavior: Behavior normal.        Thought Content: Thought content normal.        Judgment: Judgment normal.     Ortho Exam Right elbow shows full range of motion and full pronation supination.  Has point tenderness over the lateral epicondyle and the common extensor tendon.  Radial head is nontender.  Radial tunnel is nontender.  Negative Tinel at the radial tunnel.  Pain with resisted wrist extension and long finger extension.  Mild pain with passive stretch of the wrist. Specialty Comments:  No specialty comments available.  Imaging: XR Elbow Complete Right (3+View)  Result Date: 05/14/2020 Small cortical irregularity and ossicle near the lateral epicondyle.    PMFS History: Patient Active Problem List   Diagnosis Date Noted  . Bilateral carpal tunnel syndrome 09/22/2018  . Degeneration of  lumbar intervertebral disc 08/15/2017  . Pain in thoracic spine 02/14/2017  . Rotator cuff syndrome 02/14/2017  . Neck pain 12/25/2016   Past Medical History:  Diagnosis Date  . Hypertension   . Obesity   . Renal disorder     Family History  Problem Relation Age of Onset  . Cancer Mother   . Cancer Father   . Hypertension Other   . Diabetes Other   . Obesity Other   . Heart attack Other     History reviewed. No pertinent surgical history. Social History   Occupational History  . Not on file  Tobacco Use  . Smoking status: Never Smoker  . Smokeless tobacco: Not on file  Substance and Sexual Activity  . Alcohol use: Yes  . Drug use: Not on file  . Sexual activity: Not on file

## 2020-05-16 ENCOUNTER — Telehealth: Payer: Self-pay | Admitting: Orthopaedic Surgery

## 2020-05-16 NOTE — Telephone Encounter (Signed)
Called pt 1X left vm to call and set MRI review appt with Dr. Roda Shutters after 05/20/20. Will try back later.

## 2020-05-20 ENCOUNTER — Inpatient Hospital Stay: Admission: RE | Admit: 2020-05-20 | Payer: BC Managed Care – PPO | Source: Ambulatory Visit

## 2020-05-23 ENCOUNTER — Other Ambulatory Visit: Payer: Self-pay

## 2020-05-23 ENCOUNTER — Ambulatory Visit
Admission: RE | Admit: 2020-05-23 | Discharge: 2020-05-23 | Disposition: A | Discharge status: Home / Self Care | Payer: BC Managed Care – PPO | Source: Ambulatory Visit | Attending: Orthopaedic Surgery | Admitting: Orthopaedic Surgery

## 2020-05-23 DIAGNOSIS — M25521 Pain in right elbow: Secondary | ICD-10-CM

## 2020-05-28 ENCOUNTER — Ambulatory Visit: Payer: BC Managed Care – PPO | Admitting: Orthopaedic Surgery

## 2020-05-28 DIAGNOSIS — M7711 Lateral epicondylitis, right elbow: Secondary | ICD-10-CM | POA: Diagnosis not present

## 2020-05-28 NOTE — Progress Notes (Signed)
Office Visit Note   Patient: Flavio Lindroth           Date of Birth: February 23, 1975           MRN: [id number] Visit Date: 05/28/2020              Requested by: No referring provider defined for this encounter. PCP: Patient, No Pcp Per (Inactive)   Assessment & Plan: Visit Diagnoses:  1. Lateral epicondylitis, right elbow     Plan: MRI shows a high-grade partial-thickness tear of the common extensors consistent with chronic lateral epicondylitis and tendinosis.  These findings were independently reviewed and interpreted and discussed with the patient in detail.  Based on these findings my recommendation is for tennis elbow release with arthrotomy and exploration of the elbow joint with debridement as indicated based on intraoperative findings.  Risk benefits rehab recovery time out of work all reviewed with the patient.  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  Questions encouraged and answered.  Follow-Up Instructions: Return if symptoms worsen or fail to improve.   Orders:  No orders of the defined types were placed in this encounter.  No orders of the defined types were placed in this encounter.     Procedures: No procedures performed   Clinical Data: No additional findings.   Subjective: Chief Complaint  Patient presents with  . Right Elbow - Follow-up    MRI review     Karlos is a returning today to for review of the recent right elbow MRI.  Reports no changes to the elbow.   Review of Systems  Constitutional: Negative.   All other systems reviewed and are negative.    Objective: Vital Signs: There were no vitals taken for this visit.  Physical Exam Vitals and nursing note reviewed.  Constitutional:      Appearance: He is well-developed.  Pulmonary:     Effort: Pulmonary effort is  normal.  Abdominal:     Palpations: Abdomen is soft.  Skin:    General: Skin is warm.  Neurological:     Mental Status: He is alert and oriented to person, place, and time.  Psychiatric:        Behavior: Behavior normal.        Thought Content: Thought content normal.        Judgment: Judgment normal.     Ortho Exam Right elbow exam is unchanged. Specialty Comments:  No specialty comments available.  Imaging: No results found.   PMFS History: Patient Active Problem List   Diagnosis Date Noted  . Bilateral carpal tunnel syndrome 09/22/2018  . Degeneration of lumbar intervertebral disc 08/15/2017  . Pain in thoracic spine 02/14/2017  . Rotator cuff syndrome 02/14/2017  . Neck pain 12/25/2016   Past Medical History:  Diagnosis Date  . Hypertension   . Obesity   . Renal disorder     Family History  Problem Relation Age of Onset  . Cancer Mother   . Cancer Father   . Hypertension Other   . Diabetes Other   . Obesity Other   . Heart attack Other     No past surgical history on file. Social History   Occupational History  . Not on file  Tobacco Use  . Smoking status: Never Smoker  . Smokeless tobacco: Not  on file  Substance and Sexual Activity  . Alcohol use: Yes  . Drug use: Not on file  . Sexual activity: Not on file

## 2020-06-17 ENCOUNTER — Other Ambulatory Visit: Payer: Self-pay | Admitting: Physician Assistant

## 2020-06-17 MED ORDER — ONDANSETRON HCL 4 MG PO TABS: 4.0000 mg | ORAL_TABLET | Freq: Three times a day (TID) | ORAL | 0 refills | Status: DC | PRN

## 2020-06-17 MED ORDER — HYDROCODONE-ACETAMINOPHEN 5-325 MG PO TABS: 1.0000 | ORAL_TABLET | Freq: Three times a day (TID) | ORAL | 0 refills | Status: DC | PRN

## 2020-06-19 DIAGNOSIS — M7711 Lateral epicondylitis, right elbow: Secondary | ICD-10-CM | POA: Diagnosis not present

## 2020-06-27 ENCOUNTER — Encounter: Payer: Self-pay | Admitting: Orthopaedic Surgery

## 2020-06-27 ENCOUNTER — Ambulatory Visit (INDEPENDENT_AMBULATORY_CARE_PROVIDER_SITE_OTHER): Payer: BC Managed Care – PPO | Admitting: Physician Assistant

## 2020-06-27 DIAGNOSIS — M7711 Lateral epicondylitis, right elbow: Secondary | ICD-10-CM

## 2020-06-27 NOTE — Progress Notes (Signed)
   Post-Op Visit Note   Patient: Donald Gates           Date of Birth: 1975-11-02           MRN: [id number] Visit Date: 06/27/2020 PCP: Patient, No Pcp Per (Inactive)   Assessment & Plan:  Chief Complaint:  Chief Complaint  Patient presents with  . Right Elbow - Pain   Visit Diagnoses:  1. Lateral epicondylitis, right elbow     Plan: Patient is a pleasant 45 year old gentleman who comes in today 1 week out right elbow extensor release with repair.  He has been doing well.  He has been wearing a removable wrist splint.  Minimal pain.  Examination of his right elbow reveals a well-healed surgical incision without complication.  He does have mild swelling to the incision site.  No evidence of infection or cellulitis.  His fingers are swollen as well but are warm and well-perfused.  At this point, Steri-Strips were applied.  He will continue to elevate the right upper extremity.  Will allow him to work on gentle range of motion of the wrist and elbow.  No strengthening exercises at this point.  He will continue wearing his wrist splint with any lifting but is limited to just a few pounds to the right upper extremity.  He will wear the splint for a total of 6 weeks postop.  He will follow-up with [place] in 3 weeks time for repeat evaluation and transition to formal physical therapy.  Call with concerns or questions in meantime.  Follow-Up Instructions: Return in about 3 weeks (around 07/18/2020).   Orders:  No orders of the defined types were placed in this encounter.  No orders of the defined types were placed in this encounter.   Imaging: No new imaging  PMFS History: Patient Active Problem List   Diagnosis Date Noted  . Lateral epicondylitis, right elbow 06/19/2020  . Bilateral carpal tunnel syndrome 09/22/2018  . Degeneration of lumbar intervertebral disc 08/15/2017  . Pain in thoracic spine 02/14/2017  . Rotator cuff syndrome 02/14/2017  . Neck pain 12/25/2016   Past  Medical History:  Diagnosis Date  . Hypertension   . Obesity   . Renal disorder     Family History  Problem Relation Age of Onset  . Cancer Mother   . Cancer Father   . Hypertension Other   . Diabetes Other   . Obesity Other   . Heart attack Other     History reviewed. No pertinent surgical history. Social History   Occupational History  . Not on file  Tobacco Use  . Smoking status: Never Smoker  . Smokeless tobacco: Not on file  Substance and Sexual Activity  . Alcohol use: Yes  . Drug use: Not on file  . Sexual activity: Not on file

## 2020-07-18 ENCOUNTER — Other Ambulatory Visit: Payer: Self-pay

## 2020-07-18 ENCOUNTER — Ambulatory Visit (INDEPENDENT_AMBULATORY_CARE_PROVIDER_SITE_OTHER): Payer: BC Managed Care – PPO | Admitting: Orthopaedic Surgery

## 2020-07-18 ENCOUNTER — Encounter: Payer: Self-pay | Admitting: Orthopaedic Surgery

## 2020-07-18 DIAGNOSIS — M7711 Lateral epicondylitis, right elbow: Secondary | ICD-10-CM

## 2020-07-18 NOTE — Progress Notes (Signed)
   Post-Op Visit Note   Patient: Donald Gates           Date of Birth: May 24, 1975           MRN: [id number] Visit Date: 07/18/2020 PCP: Patient, No Pcp Per (Inactive)   Assessment & Plan:  Chief Complaint:  Chief Complaint  Patient presents with   Right Elbow - Routine Post Op   Visit Diagnoses:  1. Lateral epicondylitis, right elbow     Plan:   Donald Gates is 4 weeks status post right tennis elbow release and repair.  Overall he is doing better and his pain ranges from 2-5 out of 10 occasionally.  He feels more of a tightness than a true pain.  He takes Tylenol and Advil as needed.  He has been wearing the wrist brace during activity.  Right elbow shows fully healed surgical scar.  Full pronation supination.  No tenderness to palpation of the lateral epicondyle.  He only has very slight restriction in elbow extension and flexion.  Overall I am happy with his progress.  Have given him a prescription for PT to start in a couple weeks at [facility].  He will wear the wrist brace during activity for another couple weeks.  I would like to recheck in 6 weeks.  Follow-Up Instructions: Return in about 6 weeks (around 08/29/2020).   Orders:  No orders of the defined types were placed in this encounter.  No orders of the defined types were placed in this encounter.   Imaging: No results found.  PMFS History: Patient Active Problem List   Diagnosis Date Noted   Lateral epicondylitis, right elbow 06/19/2020   Bilateral carpal tunnel syndrome 09/22/2018   Degeneration of lumbar intervertebral disc 08/15/2017   Pain in thoracic spine 02/14/2017   Rotator cuff syndrome 02/14/2017   Neck pain 12/25/2016   Past Medical History:  Diagnosis Date   Hypertension    Obesity    Renal disorder     Family History  Problem Relation Age of Onset   Cancer Mother    Cancer Father    Hypertension Other    Diabetes Other    Obesity Other    Heart attack Other     History  reviewed. No pertinent surgical history. Social History   Occupational History   Not on file  Tobacco Use   Smoking status: Never   Smokeless tobacco: Not on file  Substance and Sexual Activity   Alcohol use: Yes   Drug use: Not on file   Sexual activity: Not on file

## 2020-08-29 ENCOUNTER — Ambulatory Visit (INDEPENDENT_AMBULATORY_CARE_PROVIDER_SITE_OTHER): Payer: BC Managed Care – PPO | Admitting: Physician Assistant

## 2020-08-29 ENCOUNTER — Other Ambulatory Visit: Payer: Self-pay

## 2020-08-29 ENCOUNTER — Encounter: Payer: Self-pay | Admitting: Orthopaedic Surgery

## 2020-08-29 VITALS — Ht 70.0 in | Wt 275.0 lb

## 2020-08-29 DIAGNOSIS — M7711 Lateral epicondylitis, right elbow: Secondary | ICD-10-CM

## 2020-08-29 NOTE — Progress Notes (Signed)
   Post-Op Visit Note   Patient: Donald Gates           Date of Birth: 28-Mar-1975           MRN: [id number] Visit Date: 08/29/2020 PCP: Patient, No Pcp Per (Inactive)   Assessment & Plan:  Chief Complaint:  Chief Complaint  Patient presents with   Right Elbow - Follow-up    Right tennis elbow release 06/19/2020   Visit Diagnoses:  1. Lateral epicondylitis, right elbow     Plan: Patient is a pleasant 45 year old gentleman who comes in today 10 weeks out right tennis elbow release and repair, date of surgery 06/19/2020.  He has been doing well.  He has not been wearing his brace.  He has minimal to no pain.  He just started physical therapy and has been to 2 sessions.  Overall, he feels as though he is 75% improved.  Examination of his right elbow reveals a fully healed surgical scar without complication.  Mild swelling.  No tenderness.  He has near full range of motion but still lacks approximately 10 degrees of flexion.  4 out of 5 strength.  He is neurovascular intact distally.  At this point, he will continue with physical therapy to obtain the rest of his flexion and improve his strength.  We will wear the brace as needed with activity.  Follow-up with [place] in 6 weeks time for final check.  Call with concerns or questions.  Follow-Up Instructions: Return in about 6 weeks (around 10/10/2020).   Orders:  No orders of the defined types were placed in this encounter.  No orders of the defined types were placed in this encounter.   Imaging: No new imaging  PMFS History: Patient Active Problem List   Diagnosis Date Noted   Lateral epicondylitis, right elbow 06/19/2020   Bilateral carpal tunnel syndrome 09/22/2018   Degeneration of lumbar intervertebral disc 08/15/2017   Pain in thoracic spine 02/14/2017   Rotator cuff syndrome 02/14/2017   Neck pain 12/25/2016   Past Medical History:  Diagnosis Date   Hypertension    Obesity    Renal disorder     Family History   Problem Relation Age of Onset   Cancer Mother    Cancer Father    Hypertension Other    Diabetes Other    Obesity Other    Heart attack Other     History reviewed. No pertinent surgical history. Social History   Occupational History   Not on file  Tobacco Use   Smoking status: Never   Smokeless tobacco: Not on file  Substance and Sexual Activity   Alcohol use: Yes   Drug use: Not on file   Sexual activity: Not on file

## 2020-10-09 ENCOUNTER — Other Ambulatory Visit: Payer: Self-pay

## 2020-10-09 ENCOUNTER — Ambulatory Visit: Payer: BC Managed Care – PPO | Admitting: Orthopaedic Surgery

## 2020-10-09 ENCOUNTER — Encounter: Payer: Self-pay | Admitting: Orthopaedic Surgery

## 2020-10-09 VITALS — Ht 70.0 in | Wt 275.0 lb

## 2020-10-09 DIAGNOSIS — M25521 Pain in right elbow: Secondary | ICD-10-CM | POA: Diagnosis not present

## 2020-10-09 NOTE — Progress Notes (Signed)
   Post-Op Visit Note   Patient: Donald Gates           Date of Birth: 12-11-1975           MRN: [id number] Visit Date: 10/09/2020 PCP: Patient, No Pcp Per (Inactive)   Assessment & Plan:  Chief Complaint:  Chief Complaint  Patient presents with   Right Elbow - Follow-up    Right tennis elbow release 06/19/2020   Visit Diagnoses:  1. Pain in right elbow     Plan: Patient is a pleasant 45 year old gentleman who comes in today approximately 4 months status post right tennis elbow release, date of surgery 06/19/2020.  He has been doing well.  He has returned to work without any issues.  He has regained full range of motion.  He still working on strengthening exercises.  He did have a gap in therapy several weeks ago as he was recovering from COVID.  Examination of his right elbow reveals full range of motion.  4 out of 5 strength.  He is neurovascular intact distally.  At this point, he will continue to work on strengthening exercises.  We will follow-up with [place] as needed.  Follow-Up Instructions: Return if symptoms worsen or fail to improve.   Orders:  No orders of the defined types were placed in this encounter.  No orders of the defined types were placed in this encounter.   Imaging: No new imaging  PMFS History: Patient Active Problem List   Diagnosis Date Noted   Lateral epicondylitis, right elbow 06/19/2020   Bilateral carpal tunnel syndrome 09/22/2018   Degeneration of lumbar intervertebral disc 08/15/2017   Pain in thoracic spine 02/14/2017   Rotator cuff syndrome 02/14/2017   Neck pain 12/25/2016   Past Medical History:  Diagnosis Date   Hypertension    Obesity    Renal disorder     Family History  Problem Relation Age of Onset   Cancer Mother    Cancer Father    Hypertension Other    Diabetes Other    Obesity Other    Heart attack Other     History reviewed. No pertinent surgical history. Social History   Occupational History   Not on file   Tobacco Use   Smoking status: Never   Smokeless tobacco: Not on file  Substance and Sexual Activity   Alcohol use: Yes   Drug use: Not on file   Sexual activity: Not on file

## 2020-12-16 ENCOUNTER — Ambulatory Visit: Payer: BC Managed Care – PPO | Admitting: Orthopaedic Surgery

## 2020-12-16 ENCOUNTER — Other Ambulatory Visit: Payer: Self-pay

## 2020-12-16 ENCOUNTER — Ambulatory Visit: Payer: Self-pay

## 2020-12-16 ENCOUNTER — Encounter: Payer: Self-pay | Admitting: Orthopaedic Surgery

## 2020-12-16 DIAGNOSIS — M25561 Pain in right knee: Secondary | ICD-10-CM | POA: Diagnosis not present

## 2020-12-16 DIAGNOSIS — M25562 Pain in left knee: Secondary | ICD-10-CM

## 2020-12-16 DIAGNOSIS — G8929 Other chronic pain: Secondary | ICD-10-CM

## 2020-12-16 NOTE — Progress Notes (Signed)
Office Visit Note   Patient: Donald Gates           Date of Birth: [birth year]           MRN: [id number] Visit Date: 12/16/2020              Requested by: No referring provider defined for this encounter. PCP: Patient, No Pcp Per (Inactive)   Assessment & Plan: Visit Diagnoses:  1. Chronic pain of both knees     Plan: Impression is bilateral knee pain.  He may have patella tendinitis and some inflammation of the fat pad which is often associated with patellofemoral dysfunction.  I recommended that he rest for a month or until it feels better before engaging in leg presses.  He is going to pick up some Voltaren gel to try.  He may want to consider kinesiotaping or physical therapy.  Follow-Up Instructions: No follow-ups on file.   Orders:  Orders Placed This Encounter  Procedures   XR KNEE 3 VIEW RIGHT   XR KNEE 3 VIEW LEFT   No orders of the defined types were placed in this encounter.     Procedures: No procedures performed   Clinical Data: No additional findings.   Subjective: Chief Complaint  Patient presents with   Right Knee - Pain   Left Knee - Pain    Donald Gates comes in for bilateral knee pain with dull aching that is getting worse over time.  He feels limited in what he can do because of it.  He feels popping cracking and burning.  Takes Advil 600 mg.  He has been doing a lot of squats for exercise.  He had a cortisone injection a long time ago and cannot remember if this was helpful.  The pain is mainly limited to the anterior aspect of the knee.   Review of Systems  Constitutional: Negative.   All other systems reviewed and are negative.   Objective: Vital Signs: There were no vitals taken for this visit.  Physical Exam Vitals and nursing note reviewed.  Constitutional:      Appearance: He is well-developed.  Pulmonary:     Effort: Pulmonary effort is normal.  Abdominal:     Palpations: Abdomen is soft.  Skin:    General: Skin is  warm.  Neurological:     Mental Status: He is alert and oriented to person, place, and time.  Psychiatric:        Behavior: Behavior normal.        Thought Content: Thought content normal.        Judgment: Judgment normal.    Ortho Exam  Bilateral knees show mild patellofemoral crepitus with range of motion.  Collaterals and cruciates are stable.  Strength is intact.  No joint line tenderness.  The inferior pole the patella is nontender and the patella tendon is nontender.  Specialty Comments:  No specialty comments available.  Imaging: XR KNEE 3 VIEW LEFT  Result Date: 12/16/2020 No acute or structural abnormalities  XR KNEE 3 VIEW RIGHT  Result Date: 12/16/2020 No acute or structural abnormalities    PMFS History: Patient Active Problem List   Diagnosis Date Noted   Lateral epicondylitis, right elbow 06/19/2020   Bilateral carpal tunnel syndrome 09/22/2018   Degeneration of lumbar intervertebral disc 08/15/2017   Pain in thoracic spine 02/14/2017   Rotator cuff syndrome 02/14/2017   Neck pain 12/25/2016   Past Medical History:  Diagnosis Date   Hypertension  Obesity    Renal disorder     Family History  Problem Relation Age of Onset   Cancer Mother    Cancer Father    Hypertension Other    Diabetes Other    Obesity Other    Heart attack Other     History reviewed. No pertinent surgical history. Social History   Occupational History   Not on file  Tobacco Use   Smoking status: Never   Smokeless tobacco: Not on file  Substance and Sexual Activity   Alcohol use: Yes   Drug use: Not on file   Sexual activity: Not on file

## 2021-04-28 ENCOUNTER — Other Ambulatory Visit: Payer: Self-pay

## 2021-04-28 ENCOUNTER — Ambulatory Visit (INDEPENDENT_AMBULATORY_CARE_PROVIDER_SITE_OTHER): Payer: BC Managed Care – PPO

## 2021-04-28 ENCOUNTER — Ambulatory Visit: Payer: BC Managed Care – PPO | Admitting: Orthopaedic Surgery

## 2021-04-28 DIAGNOSIS — M25512 Pain in left shoulder: Secondary | ICD-10-CM | POA: Diagnosis not present

## 2021-04-28 DIAGNOSIS — G8929 Other chronic pain: Secondary | ICD-10-CM

## 2021-04-28 DIAGNOSIS — M25551 Pain in right hip: Secondary | ICD-10-CM

## 2021-04-28 MED ORDER — PREDNISONE 10 MG (21) PO TBPK: ORAL_TABLET | ORAL | 0 refills | Status: DC

## 2021-04-28 MED ORDER — METHOCARBAMOL 500 MG PO TABS: 500.0000 mg | ORAL_TABLET | Freq: Two times a day (BID) | ORAL | 0 refills | Status: DC | PRN

## 2021-04-28 NOTE — Progress Notes (Signed)
? ?Office Visit Note ?  ?Patient: Donald Gates           ?Date of Birth: Dec 07, 1975           ?MRN: [id number] ?Visit Date: 04/28/2021 ?             ?Requested by: No referring provider defined for this encounter. ?PCP: Patient, No Pcp Per (Inactive) ? ? ?Assessment & Plan: ?Visit Diagnoses:  ?1. Pain in right hip   ?2. Chronic left shoulder pain   ? ? ?Plan: Impression is right hip early osteoarthritis and left upper extremity cervical spine radiculopathy.  In review regards to the right hip, would like to refer the patient to Dr. Ernestina Patches for fluoroscopic guided cortisone injection.  In regards to the left upper extremity radiculopathy, have started the patient on a steroid taper in addition to a muscle relaxer.  I provided him with a hard copy prescription for physical therapy.  If his symptoms do not improve over the next 6 to 8 weeks he will call and we will get an MRI of the neck.  Otherwise, follow-up with [place] as needed. ? ?Follow-Up Instructions: Return if symptoms worsen or fail to improve.  ? ?Orders:  ?Orders Placed This Encounter  ?Procedures  ? XR HIP UNILAT W OR W/O PELVIS 1V RIGHT  ? XR Shoulder Left  ? XR Cervical Spine 2 or 3 views  ? ?Meds ordered this encounter  ?Medications  ? predniSONE (STERAPRED UNI-PAK 21 TAB) 10 MG (21) TBPK tablet  ?  Sig: Take as directed  ?  Dispense:  21 tablet  ?  Refill:  0  ? methocarbamol (ROBAXIN) 500 MG tablet  ?  Sig: Take 1 tablet (500 mg total) by mouth 2 (two) times daily as needed.  ?  Dispense:  20 tablet  ?  Refill:  0  ? ? ? ? Procedures: ?No procedures performed ? ? ?Clinical Data: ?No additional findings. ? ? ?Subjective: ?Chief Complaint  ?Patient presents with  ? Right Hip - Pain  ? Left Shoulder - Pain  ? ? ?HPI patient is a pleasant 46 year old gentleman who comes in today with right hip and left shoulder pain.  In regards to the hip, he has had pain primarily to the lateral aspect with associated spasms to the groin.  He denies any injury or  change in activity.  Symptoms appear to be worse when he has been walking for 15 or 20 minutes or longer.  He denies any paresthesias to the right lower extremity.  In regards to the left shoulder, the pain is actually to the lateral neck and radiates into the parascapular region as well as down the entire arm and into the fingers where he gets tingling sensations primarily to the long, ring and small fingers.  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  He denies any history of neck or shoulder pathology. ? ?Review of Systems as detailed in HPI.  All others reviewed and are negative. ? ? ?Objective: ?Vital Signs: There were no vitals taken for this visit. ? ?Physical Exam well-developed well-nourished gentleman in no acute distress.  Alert and oriented x3. ? ?Ortho Exam right hip exam reveals a positive logroll.  Does have pain to the groin with resisted hip flexion.  No pain to the lateral hip.  Left shoulder exam reveals full active range of motion in all planes.  Full strength throughout.  Cervical  spine exam shows no spinous or paraspinous tenderness.  Does have moderate tenderness along the parascapular region.  He is neurovascular intact distally.  No focal weakness. ? ?Specialty Comments:  ?No specialty comments available. ? ?Imaging: ?XR HIP UNILAT W OR W/O PELVIS 1V RIGHT ? ?Result Date: 04/28/2021 ?Mild degenerative changes with osteophyte formation ? ?XR Cervical Spine 2 or 3 views ? ?Result Date: 04/28/2021 ?Moderate multilevel spondylosis with straightening of the cervical spine ? ?XR Shoulder Left ? ?Result Date: 04/28/2021 ?Mild degenerative changes of the [facility] joint  ? ? ?PMFS History: ?Patient Active Problem List  ? Diagnosis Date Noted  ? Lateral epicondylitis, right elbow 06/19/2020  ? Bilateral carpal tunnel syndrome 09/22/2018  ? Degeneration of lumbar intervertebral disc 08/15/2017  ? Pain in thoracic spine 02/14/2017  ? Rotator cuff syndrome 02/14/2017  ?  Neck pain 12/25/2016  ? ?Past Medical History:  ?Diagnosis Date  ? Hypertension   ? Obesity   ? Renal disorder   ?  ?Family History  ?Problem Relation Age of Onset  ? Cancer Mother   ? Cancer Father   ? Hypertension Other   ? Diabetes Other   ? Obesity Other   ? Heart attack Other   ?  ?No past surgical history on file. ?Social History  ? ?Occupational History  ? Not on file  ?Tobacco Use  ? Smoking status: Never  ? Smokeless tobacco: Not on file  ?Substance and Sexual Activity  ? Alcohol use: Yes  ? Drug use: Not on file  ? Sexual activity: Not on file  ? ? ? ? ? ? ?

## 2021-04-29 ENCOUNTER — Telehealth: Payer: Self-pay | Admitting: Orthopaedic Surgery

## 2021-04-29 NOTE — Telephone Encounter (Signed)
Pt called requesting a refill of hydrocodone. Pt states his pharmacy is out of them and asking for it to be sent to [facility] on [address]. Please call pt about this matter at [phone]. ?

## 2021-04-30 ENCOUNTER — Telehealth: Payer: Self-pay | Admitting: Physician Assistant

## 2021-04-30 ENCOUNTER — Other Ambulatory Visit: Payer: Self-pay | Admitting: Physician Assistant

## 2021-04-30 NOTE — Telephone Encounter (Signed)
Called patient no answer LMOM. Just need to advise on message below.  

## 2021-04-30 NOTE — Telephone Encounter (Signed)
Patient asked if the Rx can be sent in for Tramadol. Patient asked if the Rx can be sent to [facility] on [address]?  ?The number to contact patient is [phone] ?

## 2021-04-30 NOTE — Telephone Encounter (Signed)
We cannot refill norco, but I can send in tramadol or tylenol 3

## 2021-05-01 ENCOUNTER — Other Ambulatory Visit: Payer: Self-pay | Admitting: Physician Assistant

## 2021-05-01 MED ORDER — TRAMADOL HCL 50 MG PO TABS: 50.0000 mg | ORAL_TABLET | Freq: Two times a day (BID) | ORAL | 2 refills | Status: DC | PRN

## 2021-05-01 NOTE — Telephone Encounter (Signed)
Pharmacy is waiting for authorization for the pt medication  ? ?If there is a problem please call the pt  ?

## 2021-05-01 NOTE — Telephone Encounter (Signed)
I left voicemail for patient advising. 

## 2021-05-01 NOTE — Telephone Encounter (Signed)
sent 

## 2021-05-01 NOTE — Telephone Encounter (Signed)
I called pharmacy. Submitted to Cover My Meds. Patient called and advised. ? ?Demaris Callander Key: [id number] help? Call us at [phone] ?Status ?Sent to [employer] ?Drug ?traMADol HCl 50MG  tablets ?Form ?[employer] Electronic Request Form (CB ?

## 2021-05-07 NOTE — Telephone Encounter (Signed)
Pt is calling to find out the status of the medication --Tramadol ? ?Please call the pt  ?

## 2021-05-08 ENCOUNTER — Other Ambulatory Visit: Payer: Self-pay | Admitting: Physician Assistant

## 2021-05-08 MED ORDER — ACETAMINOPHEN-CODEINE #3 300-30 MG PO TABS: 1.0000 | ORAL_TABLET | Freq: Two times a day (BID) | ORAL | 0 refills | Status: DC | PRN

## 2021-05-08 NOTE — Telephone Encounter (Signed)
Sent in tylenol 3

## 2021-05-08 NOTE — Telephone Encounter (Signed)
Patient aware.

## 2021-06-08 ENCOUNTER — Ambulatory Visit: Payer: Self-pay

## 2021-06-08 ENCOUNTER — Ambulatory Visit: Payer: BC Managed Care – PPO | Admitting: Physical Medicine and Rehabilitation

## 2021-06-08 DIAGNOSIS — M25551 Pain in right hip: Secondary | ICD-10-CM | POA: Diagnosis not present

## 2021-06-08 NOTE — Progress Notes (Signed)
? ?  Drayke Grabel - 46 y.o. male MRN [id number]  Date of birth: 12/30/1975 ? ?Office Visit Note: ?Visit Date: 06/08/2021 ?PCP: Patient, No Pcp Per (Inactive) ?Referred by: Tarry Kos, MD ? ?Subjective: ?Chief Complaint  ?Patient presents with  ? Right Hip - Pain  ? ?HPI:  Vadhir Mcnay is a 46 y.o. male who comes in today at the request of Dr. Glee Arvin for planned Right anesthetic hip arthrogram with fluoroscopic guidance.  The patient has failed conservative care including home exercise, medications, time and activity modification.  This injection will be diagnostic and hopefully therapeutic.  Please see requesting physician notes for further details and justification.  ? ? ?ROS Otherwise per HPI. ? ?Assessment & Plan: ?Visit Diagnoses:  ?  ICD-10-CM   ?1. Pain in right hip  M25.551 Large Joint Inj: R hip joint  ?  XR C-ARM NO REPORT  ?  ?  ?Plan: No additional findings.  ? ?Meds & Orders: No orders of the defined types were placed in this encounter. ?  ?Orders Placed This Encounter  ?Procedures  ? Large Joint Inj: R hip joint  ? XR C-ARM NO REPORT  ?  ?Follow-up: Return for visit to requesting provider as needed.  ? ?Procedures: ?Large Joint Inj: R hip joint on 06/08/2021 1:23 PM ?Indications: diagnostic evaluation and pain ?Details: 22 G 3.5 in needle, fluoroscopy-guided anterior approach ? ?Arthrogram: No ? ?Medications: 4 mL bupivacaine 0.25 %; 60 mg triamcinolone acetonide 40 MG/ML ?Outcome: tolerated well, no immediate complications ? ?There was excellent flow of contrast producing a partial arthrogram of the hip. The patient did have relief of symptoms during the anesthetic phase of the injection. ?Procedure, treatment alternatives, risks and benefits explained, specific risks discussed. Consent was given by the patient. Immediately prior to procedure a time out was called to verify the correct patient, procedure, equipment, support staff and site/side marked as required. Patient was prepped and  draped in the usual sterile fashion.  ? ?  ?   ? ?Clinical History: ?No specialty comments available.  ? ? ? ?Objective:  VS:  HT:    WT:   BMI:     BP:   HR: bpm  TEMP: ( )  RESP:  ?Physical Exam  ? ?Imaging: ?No results found. ?

## 2021-06-08 NOTE — Progress Notes (Signed)
Pt state right hip pain. Pt state waling and standing makes the pain worse. Pt state he takes over the counter pain meds to help ease his pain. ? ?Numeric Pain Rating Scale and Functional Assessment ?Average Pain 2 ? ? ?In the last MONTH (on 0-10 scale) has pain interfered with the following? ? ?1. General activity like being  able to carry out your everyday physical activities such as walking, climbing stairs, carrying groceries, or moving a chair?  ?Rating(8) ? ? ? -BT, -Dye Allergies. ? ?

## 2021-06-18 MED ORDER — BUPIVACAINE HCL 0.25 % IJ SOLN
4.0000 mL | INTRAMUSCULAR | Status: AC | PRN
  Administered 2021-06-08: 4 mL via INTRA_ARTICULAR

## 2021-06-18 MED ORDER — TRIAMCINOLONE ACETONIDE 40 MG/ML IJ SUSP
60.0000 mg | INTRAMUSCULAR | Status: AC | PRN
  Administered 2021-06-08: 60 mg via INTRA_ARTICULAR

## 2021-08-28 ENCOUNTER — Ambulatory Visit: Payer: BC Managed Care – PPO | Admitting: Orthopaedic Surgery

## 2021-09-01 ENCOUNTER — Ambulatory Visit (INDEPENDENT_AMBULATORY_CARE_PROVIDER_SITE_OTHER): Payer: BC Managed Care – PPO

## 2021-09-01 ENCOUNTER — Encounter: Payer: Self-pay | Admitting: Orthopaedic Surgery

## 2021-09-01 ENCOUNTER — Ambulatory Visit: Payer: BC Managed Care – PPO | Admitting: Orthopaedic Surgery

## 2021-09-01 VITALS — Ht 72.0 in | Wt 275.0 lb

## 2021-09-01 DIAGNOSIS — M25552 Pain in left hip: Secondary | ICD-10-CM

## 2021-09-01 DIAGNOSIS — M542 Cervicalgia: Secondary | ICD-10-CM | POA: Diagnosis not present

## 2021-09-01 NOTE — Progress Notes (Signed)
Office Visit Note   Patient: Donald Gates           Date of Birth: [birth year]           MRN: [id number] Visit Date: 09/01/2021              Requested by: No referring provider defined for this encounter. PCP: Patient, No Pcp Per   Assessment & Plan: Visit Diagnoses:  1. Pain in left hip   2. Neck pain     Plan: Impression is chronic left arm radiculopathy and lateral hip pain likely abductor tendinitis.  We will order MRI cervical spine to rule out structural abnormalities.  Home exercises and rest and weight loss discussed for hip abductor tendinitis.  Follow-up after the MRI.  Follow-Up Instructions: No follow-ups on file.   Orders:  Orders Placed This Encounter  Procedures   XR HIP UNILAT W OR W/O PELVIS 2-3 VIEWS LEFT   XR Lumbar Spine 2-3 Views   MR Cervical Spine w/o contrast   No orders of the defined types were placed in this encounter.     Procedures: No procedures performed   Clinical Data: No additional findings.   Subjective: Chief Complaint  Patient presents with   Left Hip - Pain    HPI [name] returns today for follow-up of continued left arm and radicular symptoms.  Conservative treatments have been unsuccessful.  He continues to have numbness and tingling in the fingers as well.  Also complaining of lateral hip pain that is worse with hip abduction.  Denies any injuries or groin pain.  Review of Systems  Constitutional: Negative.   All other systems reviewed and are negative.    Objective: Vital Signs: Ht 6' (1.829 m)   Wt 275 lb (124.7 kg)   BMI 37.30 kg/m   Physical Exam Vitals and nursing note reviewed.  Constitutional:      Appearance: He is well-developed.  Pulmonary:     Effort: Pulmonary effort is normal.  Abdominal:     Palpations: Abdomen is soft.  Skin:    General: Skin is warm.  Neurological:     Mental Status: He is alert and oriented to person, place, and time.  Psychiatric:        Behavior: Behavior  normal.        Thought Content: Thought content normal.        Judgment: Judgment normal.     Ortho Exam Examination of left hip shows direct tenderness to the tip of the greater trochanter worse with hip abduction against gravity.  Specialty Comments:  No specialty comments available.  Imaging: XR Lumbar Spine 2-3 Views  Result Date: 09/01/2021 Mild lumbar spondylosis and degenerative disc disease.  No acute abnormalities.  XR HIP UNILAT W OR W/O PELVIS 2-3 VIEWS LEFT  Result Date: 09/01/2021 Mild osteoarthritis of the left hip joint.    PMFS History: Patient Active Problem List   Diagnosis Date Noted   Lateral epicondylitis, right elbow 06/19/2020   Bilateral carpal tunnel syndrome 09/22/2018   Degeneration of lumbar intervertebral disc 08/15/2017   Pain in thoracic spine 02/14/2017   Rotator cuff syndrome 02/14/2017   Neck pain 12/25/2016   Past Medical History:  Diagnosis Date   Hypertension    Obesity    Renal disorder     Family History  Problem Relation Age of Onset   Cancer Mother    Cancer Father    Hypertension Other    Diabetes Other  Obesity Other    Heart attack Other     History reviewed. No pertinent surgical history. Social History   Occupational History   Not on file  Tobacco Use   Smoking status: Never   Smokeless tobacco: Not on file  Substance and Sexual Activity   Alcohol use: Yes   Drug use: Not on file   Sexual activity: Not on file

## 2021-09-22 ENCOUNTER — Telehealth: Payer: Self-pay | Admitting: Orthopaedic Surgery

## 2021-09-22 ENCOUNTER — Ambulatory Visit
Admission: RE | Admit: 2021-09-22 | Discharge: 2021-09-22 | Disposition: A | Discharge status: Home / Self Care | Payer: BC Managed Care – PPO | Source: Ambulatory Visit | Attending: Orthopaedic Surgery | Admitting: Orthopaedic Surgery

## 2021-09-22 DIAGNOSIS — M542 Cervicalgia: Secondary | ICD-10-CM

## 2021-09-22 NOTE — Telephone Encounter (Signed)
Called pt 1X and left vm for pt to call and set an MRI Review appt with Dr. Roda Shutters after 09/22/21

## 2021-10-01 ENCOUNTER — Ambulatory Visit: Payer: BC Managed Care – PPO | Admitting: Orthopaedic Surgery

## 2021-10-01 DIAGNOSIS — M5412 Radiculopathy, cervical region: Secondary | ICD-10-CM

## 2021-10-01 DIAGNOSIS — M545 Low back pain, unspecified: Secondary | ICD-10-CM

## 2021-10-01 MED ORDER — IBUPROFEN 800 MG PO TABS: 800.0000 mg | ORAL_TABLET | Freq: Three times a day (TID) | ORAL | 2 refills | Status: AC | PRN

## 2021-10-01 MED ORDER — CYCLOBENZAPRINE HCL 5 MG PO TABS: 5.0000 mg | ORAL_TABLET | Freq: Three times a day (TID) | ORAL | 3 refills | Status: AC | PRN

## 2021-10-01 NOTE — Progress Notes (Signed)
   Office Visit Note   Donald Gates: Donald Gates           Date of Birth: June 02, 1975           MRN: [id number] Visit Date: 10/01/2021              Requested by: Donald referring provider defined for this encounter. PCP: Donald Gates, Donald Gates   Assessment & Plan: Visit Diagnoses:  1. Cervical radiculopathy   2. Acute right-sided low back pain without sciatica     Plan: In regards to cervical spine the MRI was reviewed with the Donald Gates which shows several areas of stenosis.  We will make a referral to Donald Gates for her cervical spine ESI.  In regards to the back pain we will send him to [facility] for this.  He is not reporting any signs or symptoms of cholecystitis.  Follow-Up Instructions: Donald follow-ups on file.   Orders:  Donald orders of the defined types were placed in this encounter.  Meds ordered this encounter  Medications   cyclobenzaprine (FLEXERIL) 5 MG tablet    Sig: Take 1-2 tablets (5-10 mg total) by mouth 3 (three) times daily as needed for muscle spasms.    Dispense:  30 tablet    Refill:  3   ibuprofen (ADVIL) 800 MG tablet    Sig: Take 1 tablet (800 mg total) by mouth every 8 (eight) hours as needed.    Dispense:  30 tablet    Refill:  2      Procedures: Donald procedures performed   Clinical Data: Donald additional findings.   Subjective: Chief Complaint  Donald Gates presents with   Spine - Follow-up    HPI Donald Gates is here to review C-spine MRI and evaluation of new problem of right-sided mid back pain.  Review of Systems   Objective: Vital Signs: There were Donald vitals taken for this visit.  Physical Exam  Ortho Exam Examination of the cervical spine is unchanged. Examination of the mid back shows paraspinous tenderness with some radiation to the scapula.  Specialty Comments:  Donald specialty comments available.  Imaging: Donald results found.   PMFS History: Donald Gates Active Problem List   Diagnosis Date Noted   Cervical radiculopathy 10/01/2021    Lateral epicondylitis, right elbow 06/19/2020   Bilateral carpal tunnel syndrome 09/22/2018   Degeneration of lumbar intervertebral disc 08/15/2017   Pain in thoracic spine 02/14/2017   Rotator cuff syndrome 02/14/2017   Neck pain 12/25/2016   Past Medical History:  Diagnosis Date   Hypertension    Obesity    Renal disorder     Family History  Problem Relation Age of Onset   Cancer Mother    Cancer Father    Hypertension Other    Diabetes Other    Obesity Other    Heart attack Other     Donald past surgical history on file. Social History   Occupational History   Not on file  Tobacco Use   Smoking status: Never   Smokeless tobacco: Not on file  Substance and Sexual Activity   Alcohol use: Yes   Drug use: Not on file   Sexual activity: Not on file

## 2021-10-19 ENCOUNTER — Ambulatory Visit: Payer: BC Managed Care – PPO | Admitting: Physical Medicine and Rehabilitation

## 2021-10-19 ENCOUNTER — Encounter: Payer: Self-pay | Admitting: Physical Medicine and Rehabilitation

## 2021-10-19 ENCOUNTER — Ambulatory Visit: Payer: Self-pay

## 2021-10-19 VITALS — BP 126/75 | HR 58 | Ht 71.5 in | Wt 270.0 lb

## 2021-10-19 DIAGNOSIS — M5412 Radiculopathy, cervical region: Secondary | ICD-10-CM | POA: Diagnosis not present

## 2021-10-19 MED ORDER — METHYLPREDNISOLONE ACETATE 80 MG/ML IJ SUSP
40.0000 mg | Freq: Once | INTRAMUSCULAR | Status: AC
  Administered 2021-10-19: 40 mg

## 2021-10-19 NOTE — Patient Instructions (Signed)

## 2021-10-19 NOTE — Progress Notes (Signed)
Patient presents with left arm pain and tingling x a couple of years. No known injury. Complains of tingling in all fingers this morning. He is not taking anything for the pain.      Numeric Pain Rating Scale and Functional Assessment Average Pain 3   In the last MONTH (on 0-10 scale) has pain interfered with the following?  1. General activity like being  able to carry out your everyday physical activities such as walking, climbing stairs, carrying groceries, or moving a chair?  Rating(7)   +Driver, -BT, -Dye Allergies.

## 2021-10-25 NOTE — Progress Notes (Signed)
Donald Gates - 46 y.o. male MRN [id number]  Date of birth: Nov 28, 1975  Office Visit Note: Visit Date: 10/19/2021 PCP: Patient, No Pcp Per Referred by: Leandrew Koyanagi, MD  Subjective: Chief Complaint  Patient presents with   Left Arm - Pain   HPI:  Donald Gates is a 46 y.o. male who comes in today at the request of Dr. Eduard Roux for planned Left C7-T1 Cervical Interlaminar epidural steroid injection with fluoroscopic guidance.  The patient has failed conservative care including home exercise, medications, time and activity modification.  This injection will be diagnostic and hopefully therapeutic.  Please see requesting physician notes for further details and justification.  Tingling in the hand which is chronic on the left does not seem to fit with MRI pathology per se.  Consider electrodiagnostic studies.  Patient carries a diagnosis of carpal tunnel syndrome but I see no electrodiagnostic study.  If injection helps both problems then clearly this may be radicular but I do feel like he has some level of carpal tunnel syndrome in the left hand.   ROS Otherwise per HPI.  Assessment & Plan: Visit Diagnoses:    ICD-10-CM   1. Cervical radiculopathy  M54.12 XR C-ARM NO REPORT    Epidural Steroid injection    methylPREDNISolone acetate (DEPO-MEDROL) injection 40 mg      Plan: No additional findings.   Meds & Orders:  Meds ordered this encounter  Medications   methylPREDNISolone acetate (DEPO-MEDROL) injection 40 mg    Orders Placed This Encounter  Procedures   XR C-ARM NO REPORT   Epidural Steroid injection    Follow-up: Return for visit to requesting provider as needed.   Procedures: No procedures performed  Cervical Epidural Steroid Injection - Interlaminar Approach with Fluoroscopic Guidance  Patient: Donald Gates      Date of Birth: 05/19/1975 MRN: [id number] PCP: Patient, No Pcp Per      Visit Date: 10/19/2021   Universal Protocol:    Date/Time:  09/17/235:14 PM  Consent Given By: the patient  Position: PRONE  Additional Comments: Vital signs were monitored before and after the procedure. Patient was prepped and draped in the usual sterile fashion. The correct patient, procedure, and site was verified.   Injection Procedure Details:   Procedure diagnoses: Cervical radiculopathy [M54.12]    Meds Administered:  Meds ordered this encounter  Medications   methylPREDNISolone acetate (DEPO-MEDROL) injection 40 mg     Laterality: Left  Location/Site: C7-T1  Needle: 3.5 in., 20 ga. Tuohy  Needle Placement: Paramedian epidural space  Findings:  -Comments: Excellent flow of contrast into the epidural space.  Procedure Details: Using a paramedian approach from the side mentioned above, the region overlying the inferior lamina was localized under fluoroscopic visualization and the soft tissues overlying this structure were infiltrated with 4 ml. of 1% Lidocaine without Epinephrine. A # 20 gauge, Tuohy needle was inserted into the epidural space using a paramedian approach.  The epidural space was localized using loss of resistance along with contralateral oblique bi-planar fluoroscopic views.  After negative aspirate for air, blood, and CSF, a 2 ml. volume of Isovue-250 was injected into the epidural space and the flow of contrast was observed. Radiographs were obtained for documentation purposes.   The injectate was administered into the level noted above.  Additional Comments:  The patient tolerated the procedure well Dressing: 2 x 2 sterile gauze and Band-Aid    Post-procedure details: Patient was observed during the  procedure. Post-procedure instructions were reviewed.  Patient left the clinic in stable condition.   Clinical History: MRI CERVICAL SPINE WITHOUT CONTRAST   TECHNIQUE: Multiplanar, multisequence MR imaging of the cervical spine was performed. No intravenous contrast was administered.    COMPARISON:  Prior radiograph from 04/28/2021.   FINDINGS: Alignment: Straightening of the normal cervical lordosis. No listhesis.   Vertebrae: Vertebral body height maintained without acute or chronic fracture. Bone marrow signal intensity within normal limits. No discrete or worrisome osseous lesions. No abnormal marrow edema.   Cord: Normal signal and morphology.   Posterior Fossa, vertebral arteries, paraspinal tissues: Unremarkable.   Disc levels:   C2-C3: Mild disc bulge with uncovertebral spurring. No spinal stenosis. Foramina remain patent.   C3-C4: Small central disc protrusion indents the ventral thecal sac (series 5, image 10). Mild spinal stenosis with minimal flattening of the ventral cord, but no cord signal changes. Foramina remain patent.   C4-C5: Broad-based left eccentric disc osteophyte complex. Flattening and partial effacement of the ventral thecal sac, worse on the left. Mild spinal stenosis with moderate left C5 foraminal narrowing. Right neural foramen remains patent.   C5-C6: Disc bulge with bilateral uncovertebral spurring. Flattening and partial effacement of the ventral thecal sac with no more than mild spinal stenosis. Mild to moderate right C6 foraminal narrowing. Left neural foramen remains patent.   C6-C7: Minimal disc bulge with right-sided uncovertebral spurring. No spinal stenosis. Moderate right C7 foraminal narrowing. Left neural foramen is patent.   C7-T1: Seen only on sagittal projection. Grossly unremarkable. No stenosis.   Visualized upper thoracic spine demonstrates no significant finding.   IMPRESSION: 1. Left eccentric disc osteophyte complex at C4-5 with resultant mild spinal stenosis, with moderate left C5 foraminal narrowing. 2. Small central disc protrusion at C3-4 with resultant mild spinal stenosis. 3. Disc bulging with uncovertebral spurring at C5-6 with resultant mild spinal stenosis, with mild to moderate right  C6 foraminal narrowing. 4. Disc bulge with uncovertebral spurring at C6-7 with resultant moderate right C7 foraminal stenosis.     Electronically Signed   By: Rise Mu M.D.   On: 09/23/2021 16:33     Objective:  VS:  HT:5' 11.5" (181.6 cm)   WT:270 lb (122.5 kg)  BMI:37.14    BP:126/75  HR:(!) 58bpm  TEMP: ( )  RESP:  Physical Exam Vitals and nursing note reviewed.  Constitutional:      General: He is not in acute distress.    Appearance: Normal appearance. He is obese. He is not ill-appearing.  HENT:     Head: Normocephalic and atraumatic.     Right Ear: External ear normal.     Left Ear: External ear normal.  Eyes:     Extraocular Movements: Extraocular movements intact.  Cardiovascular:     Rate and Rhythm: Normal rate.     Pulses: Normal pulses.  Abdominal:     General: There is no distension.     Palpations: Abdomen is soft.  Musculoskeletal:        General: No signs of injury.     Cervical back: Neck supple. Tenderness present. No rigidity.     Right lower leg: No edema.     Left lower leg: No edema.     Comments: Patient has good strength in the upper extremities with 5 out of 5 strength in wrist extension long finger flexion APB.  No intrinsic hand muscle atrophy.  Negative Hoffmann's test.  Lymphadenopathy:     Cervical: No cervical adenopathy.  Skin:    Findings: No erythema or rash.  Neurological:     General: No focal deficit present.     Mental Status: He is alert and oriented to person, place, and time.     Sensory: No sensory deficit.     Motor: No weakness or abnormal muscle tone.     Coordination: Coordination normal.  Psychiatric:        Mood and Affect: Mood normal.        Behavior: Behavior normal.      Imaging: No results found.

## 2021-10-25 NOTE — Procedures (Signed)
Cervical Epidural Steroid Injection - Interlaminar Approach with Fluoroscopic Guidance  Patient: Donald Gates      Date of Birth: Apr 24, 1975 MRN: [id number] PCP: Patient, No Pcp Per      Visit Date: 10/19/2021   Universal Protocol:    Date/Time: 09/17/235:14 PM  Consent Given By: the patient  Position: PRONE  Additional Comments: Vital signs were monitored before and after the procedure. Patient was prepped and draped in the usual sterile fashion. The correct patient, procedure, and site was verified.   Injection Procedure Details:   Procedure diagnoses: Cervical radiculopathy [M54.12]    Meds Administered:  Meds ordered this encounter  Medications   methylPREDNISolone acetate (DEPO-MEDROL) injection 40 mg     Laterality: Left  Location/Site: C7-T1  Needle: 3.5 in., 20 ga. Tuohy  Needle Placement: Paramedian epidural space  Findings:  -Comments: Excellent flow of contrast into the epidural space.  Procedure Details: Using a paramedian approach from the side mentioned above, the region overlying the inferior lamina was localized under fluoroscopic visualization and the soft tissues overlying this structure were infiltrated with 4 ml. of 1% Lidocaine without Epinephrine. A # 20 gauge, Tuohy needle was inserted into the epidural space using a paramedian approach.  The epidural space was localized using loss of resistance along with contralateral oblique bi-planar fluoroscopic views.  After negative aspirate for air, blood, and CSF, a 2 ml. volume of Isovue-250 was injected into the epidural space and the flow of contrast was observed. Radiographs were obtained for documentation purposes.   The injectate was administered into the level noted above.  Additional Comments:  The patient tolerated the procedure well Dressing: 2 x 2 sterile gauze and Band-Aid    Post-procedure details: Patient was observed during the procedure. Post-procedure instructions were  reviewed.  Patient left the clinic in stable condition.

## 2022-11-02 ENCOUNTER — Ambulatory Visit: Payer: BC Managed Care – PPO | Admitting: Physician Assistant

## 2022-11-03 ENCOUNTER — Ambulatory Visit: Payer: BC Managed Care – PPO | Admitting: Orthopaedic Surgery

## 2022-11-03 ENCOUNTER — Encounter: Payer: Self-pay | Admitting: Orthopaedic Surgery

## 2022-11-03 DIAGNOSIS — R202 Paresthesia of skin: Secondary | ICD-10-CM

## 2022-11-03 NOTE — Progress Notes (Signed)
Office Visit Note   Patient: Donald Gates           Date of Birth: Nov 26, 1975           MRN: [id number] Visit Date: 11/03/2022              Requested by: No referring provider defined for this encounter. PCP: Patient, No Pcp Per   Assessment & Plan: Visit Diagnoses:  1. Paresthesia of hand, bilateral     Plan: Impression is bilateral hand paresthesias left greater than right.  I believe the patient has a component of both carpal tunnel syndrome and cervical spine radiculopathy.  I would like to make a referral to Dr. Alvester Morin for nerve conduction study of both upper extremities.  We have also provided the patient with night splints.  He will follow-up with [place] once the nerve conduction studies have been completed.  Call with concerns or questions.  Follow-Up Instructions: Return for f/u after NCS.   Orders:  No orders of the defined types were placed in this encounter.  No orders of the defined types were placed in this encounter.     Procedures: No procedures performed   Clinical Data: No additional findings.   Subjective: Chief Complaint  Patient presents with   Right Hand - Pain, Numbness   Left Hand - Numbness, Pain    HPI patient is a pleasant 48 year old right-hand-dominant gentleman who comes in today with bilateral hand paresthesias left greater than right.  He notes numbness and tingling to the thumb, index, long and ring fingers of the left hand as well as paresthesias to the right thumb and index fingers.  Symptoms occur when he is working as he does a lot with his hands.  No previous nerve conduction study.  Does have a history of cervical spine pathology which includes mild spinal stenosis C4-5, moderate left C5 foraminal narrowing as well as a small disc protrusion C3-4 with mild stenosis.  Also noted is disc bulging C5-6 with mild spinal stenosis and moderate right C6 foraminal narrowing as well as a disc bulge at C6-7 with moderate right C7 foraminal  stenosis.  Previously seen by Dr. Alvester Morin for ESI.  Review of Systems as detailed in HPI.  All others reviewed and are negative.   Objective: Vital Signs: There were no vitals taken for this visit.  Physical Exam well-developed well-nourished gentleman in no acute distress.  Alert and oriented x 3.  Ortho Exam bilateral hand exam reveals decreased sensation to the index, long and ring fingers of both hands.  No thenar atrophy.  He does have paresthesias with Phalen and Tinel testing at the wrist.  Specialty Comments:  MRI CERVICAL SPINE WITHOUT CONTRAST   TECHNIQUE: Multiplanar, multisequence MR imaging of the cervical spine was performed. No intravenous contrast was administered.   COMPARISON:  Prior radiograph from 04/28/2021.   FINDINGS: Alignment: Straightening of the normal cervical lordosis. No listhesis.   Vertebrae: Vertebral body height maintained without acute or chronic fracture. Bone marrow signal intensity within normal limits. No discrete or worrisome osseous lesions. No abnormal marrow edema.   Cord: Normal signal and morphology.   Posterior Fossa, vertebral arteries, paraspinal tissues: Unremarkable.   Disc levels:   C2-C3: Mild disc bulge with uncovertebral spurring. No spinal stenosis. Foramina remain patent.   C3-C4: Small central disc protrusion indents the ventral thecal sac (series 5, image 10). Mild spinal stenosis with minimal flattening of the ventral cord, but no cord signal changes. Foramina  remain patent.   C4-C5: Broad-based left eccentric disc osteophyte complex. Flattening and partial effacement of the ventral thecal sac, worse on the left. Mild spinal stenosis with moderate left C5 foraminal narrowing. Right neural foramen remains patent.   C5-C6: Disc bulge with bilateral uncovertebral spurring. Flattening and partial effacement of the ventral thecal sac with no more than mild spinal stenosis. Mild to moderate right C6 foraminal  narrowing. Left neural foramen remains patent.   C6-C7: Minimal disc bulge with right-sided uncovertebral spurring. No spinal stenosis. Moderate right C7 foraminal narrowing. Left neural foramen is patent.   C7-T1: Seen only on sagittal projection. Grossly unremarkable. No stenosis.   Visualized upper thoracic spine demonstrates no significant finding.   IMPRESSION: 1. Left eccentric disc osteophyte complex at C4-5 with resultant mild spinal stenosis, with moderate left C5 foraminal narrowing. 2. Small central disc protrusion at C3-4 with resultant mild spinal stenosis. 3. Disc bulging with uncovertebral spurring at C5-6 with resultant mild spinal stenosis, with mild to moderate right C6 foraminal narrowing. 4. Disc bulge with uncovertebral spurring at C6-7 with resultant moderate right C7 foraminal stenosis.     Electronically Signed   By: Rise Mu M.D.   On: 09/23/2021 16:33  Imaging: No results found.   PMFS History: Patient Active Problem List   Diagnosis Date Noted   Cervical radiculopathy 10/01/2021   Lateral epicondylitis, right elbow 06/19/2020   Bilateral carpal tunnel syndrome 09/22/2018   Degeneration of lumbar intervertebral disc 08/15/2017   Pain in thoracic spine 02/14/2017   Rotator cuff syndrome 02/14/2017   Neck pain 12/25/2016   Past Medical History:  Diagnosis Date   Hypertension    Obesity    Renal disorder     Family History  Problem Relation Age of Onset   Cancer Mother    Cancer Father    Hypertension Other    Diabetes Other    Obesity Other    Heart attack Other     History reviewed. No pertinent surgical history. Social History   Occupational History   Not on file  Tobacco Use   Smoking status: Never   Smokeless tobacco: Not on file  Substance and Sexual Activity   Alcohol use: Yes   Drug use: Not on file   Sexual activity: Not on file

## 2022-11-15 ENCOUNTER — Telehealth: Payer: Self-pay | Admitting: Physical Medicine and Rehabilitation

## 2022-11-15 NOTE — Telephone Encounter (Signed)
Patient called and wanted to set up nerve study. CB#[phone]

## 2022-11-16 NOTE — Telephone Encounter (Signed)
LVM to return call to schedule NCV  

## 2022-11-17 ENCOUNTER — Telehealth: Payer: Self-pay | Admitting: Physical Medicine and Rehabilitation

## 2022-11-17 NOTE — Telephone Encounter (Signed)
Patient called to set up a nerve production study. CB#[phone]

## 2022-11-19 NOTE — Telephone Encounter (Signed)
Spoke with patient and scheduled NCV for 11/26/22.

## 2022-11-26 ENCOUNTER — Encounter: Payer: BC Managed Care – PPO | Admitting: Physical Medicine and Rehabilitation

## 2022-12-07 ENCOUNTER — Encounter: Payer: Self-pay | Admitting: Physician Assistant

## 2022-12-07 ENCOUNTER — Other Ambulatory Visit (INDEPENDENT_AMBULATORY_CARE_PROVIDER_SITE_OTHER): Payer: BC Managed Care – PPO

## 2022-12-07 ENCOUNTER — Ambulatory Visit: Payer: BC Managed Care – PPO | Admitting: Physician Assistant

## 2022-12-07 DIAGNOSIS — M25571 Pain in right ankle and joints of right foot: Secondary | ICD-10-CM

## 2022-12-07 DIAGNOSIS — M7661 Achilles tendinitis, right leg: Secondary | ICD-10-CM | POA: Diagnosis not present

## 2022-12-07 MED ORDER — MELOXICAM 15 MG PO TABS: 15.0000 mg | ORAL_TABLET | Freq: Every day | ORAL | 0 refills | Status: AC

## 2022-12-07 NOTE — Progress Notes (Signed)
Office Visit Note   Patient: Donald Gates           Date of Birth: [birth year]           MRN: [id number] Visit Date: 12/07/2022              Requested by: No referring provider defined for this encounter. PCP: Patient, No Pcp Per   Assessment & Plan: Visit Diagnoses:  1. Pain in joint involving right ankle and foot   2. Tendonitis, Achilles, right     Plan: Patient is a pleasant 47 year old gentleman with a 31-month history of right posterior ankle pain.  Denies any injuries.  Has a history of plantar fasciitis.  Notes that the pain in his posterior ankle is worse when he first arises in the morning.  He tries to wear good shoe wear.  Denies any particular injury.  Rates his pain is moderate plus.  Cannot wear closed back shoes.  He says it the top of the shoe hits right where he is painful.  I reviewed his x-rays and exam findings consistent with insertional Achilles tendinitis.  His Achilles is intact but he is quite tight.  We talked about the natural history of this.  We discussed trying a course of physical therapy and immobilization in a boot with a heel lift.  Also discussed trying Voltaren gel and oral meloxicam which she is taken in the past.  Briefly discussed surgical takedown of the Haglund's deformity and spur and attachment of the Achilles tendon he is not ready to do this at this point.  Will refer him to physical therapy at [facility] which is his preference.  Will follow-up with me in 1 month  Follow-Up Instructions: Return in about 4 weeks (around 01/04/2023).   Orders:  Orders Placed This Encounter  Procedures   XR Ankle Complete Right   Ambulatory referral to Physical Therapy   Meds ordered this encounter  Medications   meloxicam (MOBIC) 15 MG tablet    Sig: Take 1 tablet (15 mg total) by mouth daily.    Dispense:  30 tablet    Refill:  0      Procedures: No procedures performed   Clinical Data: No additional  findings.   Subjective: Chief Complaint  Patient presents with   Right Ankle - Pain    HPI pleasant 47 year old gentleman with a history of 2 months of right posterior ankle pain denies any injury.  Has tried taking ibuprofen periodically.  Denies any calf pain denies any fever or chills.  Denies any redness in his ankle.  Cannot wear closed back shoe  Review of Systems  All other systems reviewed and are negative.    Objective: Vital Signs: There were no vitals taken for this visit.  Physical Exam Constitutional:      Appearance: Normal appearance.  Pulmonary:     Effort: Pulmonary effort is normal.  Skin:    General: Skin is warm and dry.  Neurological:     General: No focal deficit present.     Mental Status: He is alert and oriented to person, place, and time.     Ortho Exam Examination he is neurovascularly intact.  He does have some pitting edema in his bilateral lower extremities but no evidence of cellulitis compartments are soft and compressible does have tightness within his Achilles on the right and left.  Negative Homans' sign.  Pulses intact foot is warm and pink brisk capillary refill no evidence  of infection no tenderness in the foot or ankle joint.  Is tender over the posterior insertion of the Achilles tendon.  Achilles is intact Specialty Comments:  MRI CERVICAL SPINE WITHOUT CONTRAST   TECHNIQUE: Multiplanar, multisequence MR imaging of the cervical spine was performed. No intravenous contrast was administered.   COMPARISON:  Prior radiograph from 04/28/2021.   FINDINGS: Alignment: Straightening of the normal cervical lordosis. No listhesis.   Vertebrae: Vertebral body height maintained without acute or chronic fracture. Bone marrow signal intensity within normal limits. No discrete or worrisome osseous lesions. No abnormal marrow edema.   Cord: Normal signal and morphology.   Posterior Fossa, vertebral arteries, paraspinal  tissues: Unremarkable.   Disc levels:   C2-C3: Mild disc bulge with uncovertebral spurring. No spinal stenosis. Foramina remain patent.   C3-C4: Small central disc protrusion indents the ventral thecal sac (series 5, image 10). Mild spinal stenosis with minimal flattening of the ventral cord, but no cord signal changes. Foramina remain patent.   C4-C5: Broad-based left eccentric disc osteophyte complex. Flattening and partial effacement of the ventral thecal sac, worse on the left. Mild spinal stenosis with moderate left C5 foraminal narrowing. Right neural foramen remains patent.   C5-C6: Disc bulge with bilateral uncovertebral spurring. Flattening and partial effacement of the ventral thecal sac with no more than mild spinal stenosis. Mild to moderate right C6 foraminal narrowing. Left neural foramen remains patent.   C6-C7: Minimal disc bulge with right-sided uncovertebral spurring. No spinal stenosis. Moderate right C7 foraminal narrowing. Left neural foramen is patent.   C7-T1: Seen only on sagittal projection. Grossly unremarkable. No stenosis.   Visualized upper thoracic spine demonstrates no significant finding.   IMPRESSION: 1. Left eccentric disc osteophyte complex at C4-5 with resultant mild spinal stenosis, with moderate left C5 foraminal narrowing. 2. Small central disc protrusion at C3-4 with resultant mild spinal stenosis. 3. Disc bulging with uncovertebral spurring at C5-6 with resultant mild spinal stenosis, with mild to moderate right C6 foraminal narrowing. 4. Disc bulge with uncovertebral spurring at C6-7 with resultant moderate right C7 foraminal stenosis.     Electronically Signed   By: Rise Mu M.D.   On: 09/23/2021 16:33  Imaging: No results found.   PMFS History: Patient Active Problem List   Diagnosis Date Noted   Tendonitis, Achilles, right 12/07/2022   Cervical radiculopathy 10/01/2021   Lateral epicondylitis, right  elbow 06/19/2020   Bilateral carpal tunnel syndrome 09/22/2018   Degeneration of lumbar intervertebral disc 08/15/2017   Pain in thoracic spine 02/14/2017   Rotator cuff syndrome 02/14/2017   Neck pain 12/25/2016   Past Medical History:  Diagnosis Date   Hypertension    Obesity    Renal disorder     Family History  Problem Relation Age of Onset   Cancer Mother    Cancer Father    Hypertension Other    Diabetes Other    Obesity Other    Heart attack Other     History reviewed. No pertinent surgical history. Social History   Occupational History   Not on file  Tobacco Use   Smoking status: Never   Smokeless tobacco: Not on file  Substance and Sexual Activity   Alcohol use: Yes   Drug use: Not on file   Sexual activity: Not on file

## 2022-12-14 ENCOUNTER — Ambulatory Visit: Payer: BC Managed Care – PPO | Admitting: Physical Medicine and Rehabilitation

## 2022-12-14 ENCOUNTER — Encounter: Payer: Self-pay | Admitting: Physical Medicine and Rehabilitation

## 2022-12-14 DIAGNOSIS — M542 Cervicalgia: Secondary | ICD-10-CM

## 2022-12-14 DIAGNOSIS — R202 Paresthesia of skin: Secondary | ICD-10-CM | POA: Diagnosis not present

## 2022-12-14 DIAGNOSIS — M79642 Pain in left hand: Secondary | ICD-10-CM

## 2022-12-14 DIAGNOSIS — M79641 Pain in right hand: Secondary | ICD-10-CM | POA: Diagnosis not present

## 2022-12-14 DIAGNOSIS — M5412 Radiculopathy, cervical region: Secondary | ICD-10-CM

## 2022-12-14 DIAGNOSIS — R531 Weakness: Secondary | ICD-10-CM

## 2022-12-14 NOTE — Progress Notes (Signed)
Patient is here for Bil NCS. He is having Tingling in Bil hands. Left is worse than right. Wears wrist braces at night.

## 2022-12-19 NOTE — Progress Notes (Unsigned)
Donald Gates - 47 y.o. male MRN [id number]  Date of birth: 1976/02/03  Office Visit Note: Visit Date: 12/14/2022 PCP: Patient, No Pcp Per Referred by: Tarry Kos, MD  Subjective: Chief Complaint  Patient presents with   Right Hand - Tingling   Left Hand - Tingling   HPI: Donald Gates is a 47 y.o. male who comes in todayHPI   ROS Otherwise per HPI.  Assessment & Plan: Visit Diagnoses:    ICD-10-CM   1. Paresthesia of skin  R20.2 NCV with EMG (electromyography)       Plan: Impression: Essentially NORMAL electrodiagnostic study of both upper limbs.  There is no significant electrodiagnostic evidence of nerve entrapment, brachial plexopathy or cervical radiculopathy.    As you know, purely sensory or demyelinating radiculopathies and chemical radiculitis may not be detected with this particular electrodiagnostic study.  Recommendations: 1.  Follow-up with referring physician. 2.  Continue current management of symptoms.  Meds & Orders: No orders of the defined types were placed in this encounter.   Orders Placed This Encounter  Procedures   NCV with EMG (electromyography)    Follow-up: No follow-ups on file.   Procedures: No procedures performed  EMG & NCV Findings: Evaluation of the left ulnar sensory nerve showed reduced amplitude (11.3 V).  All remaining nerves (as indicated in the following tables) were within normal limits.  Left vs. Right side comparison data for the ulnar motor nerve indicates abnormal L-R amplitude difference (61.3 %).  All remaining left vs. right side differences were within normal limits.    All examined muscles (as indicated in the following table) showed no evidence of electrical instability.    Impression: Essentially NORMAL electrodiagnostic study of both upper limbs.  There is no significant electrodiagnostic evidence of nerve entrapment, brachial plexopathy or cervical radiculopathy.    As you know, purely sensory or  demyelinating radiculopathies and chemical radiculitis may not be detected with this particular electrodiagnostic study.  Recommendations: 1.  Follow-up with referring physician. 2.  Continue current management of symptoms.  ___________________________ [name] FAAPMR Board Certified, American Board of Physical Medicine and Rehabilitation    Nerve Conduction Studies Anti Sensory Summary Table   Stim Site NR Peak (ms) Norm Peak (ms) P-T Amp (V) Norm P-T Amp Site1 Site2 Delta-P (ms) Dist (cm) Vel (m/s) Norm Vel (m/s)  Left Median Acr Palm Anti Sensory (2nd Digit)  31.3C  Wrist    3.1 <3.6 20.8 >10 Wrist Palm 1.6 0.0    Palm    1.5 <2.0 19.6         Right Median Acr Palm Anti Sensory (2nd Digit)  31.7C  Wrist    3.3 <3.6 23.6 >10 Wrist Palm 1.6 0.0    Palm    1.7 <2.0 24.3         Left Radial Anti Sensory (Base 1st Digit)  30.7C  Wrist    1.8 <3.1 27.6  Wrist Base 1st Digit 1.8 0.0    Right Radial Anti Sensory (Base 1st Digit)  31.4C  Wrist    1.8 <3.1 40.4  Wrist Base 1st Digit 1.8 0.0    Left Ulnar Anti Sensory (5th Digit)  31.6C  Wrist    3.2 <3.7 *11.3 >15.0 Wrist 5th Digit 3.2 14.0 44 >38  Right Ulnar Anti Sensory (5th Digit)  32.1C  Wrist    3.0 <3.7 31.4 >15.0 Wrist 5th Digit 3.0 14.0 47 >38   Motor Summary Table  Stim Site NR Onset (ms) Norm Onset (ms) O-P Amp (mV) Norm O-P Amp Site1 Site2 Delta-0 (ms) Dist (cm) Vel (m/s) Norm Vel (m/s)  Left Median Motor (Abd Poll Brev)  30.9C  Wrist    3.0 <4.2 10.5 >5 Elbow Wrist 4.0 23.0 58 >50  Elbow    7.0  10.1         Right Median Motor (Abd Poll Brev)  31.7C  Wrist    3.0 <4.2 9.1 >5 Elbow Wrist 4.3 24.0 56 >50  Elbow    7.3  3.6         Left Ulnar Motor (Abd Dig Min)  31.3C  Wrist    2.8 <4.2 3.6 >3 B Elbow Wrist 3.5 20.5 59 >53  B Elbow    6.3  9.7  A Elbow B Elbow 1.1 11.0 100 >53  A Elbow    7.4  10.0         Right Ulnar Motor (Abd Dig Min)  31.7C  Wrist    2.7 <4.2 9.3 >3 B Elbow Wrist 3.7 22.5 61 >53  B  Elbow    6.4  9.5  A Elbow B Elbow 1.1 12.0 109 >53  A Elbow    7.5  9.8          EMG   Side Muscle Nerve Root Ins Act Fibs Psw Amp Dur Poly Recrt Int Dennie Bible Comment  Right 1stDorInt Ulnar C8-T1 Nml Nml Nml Nml Nml 0 Nml Nml   Right Abd Poll Brev Median C8-T1 Nml Nml Nml Nml Nml 0 Nml Nml   Right ExtDigCom   Nml Nml Nml Nml Nml 0 Nml Nml   Right Triceps Radial C6-7-8 Nml Nml Nml Nml Nml 0 Nml Nml   Right Deltoid Axillary C5-6 Nml Nml Nml Nml Nml 0 Nml Nml   Left 1stDorInt Ulnar C8-T1 Nml Nml Nml Nml Nml 0 Nml Nml   Left Abd Poll Brev Median C8-T1 Nml Nml Nml Nml Nml 0 Nml Nml   Left ExtDigCom   Nml Nml Nml Nml Nml 0 Nml Nml   Left Triceps Radial C6-7-8 Nml Nml Nml Nml Nml 0 Nml Nml   Left Deltoid Axillary C5-6 Nml Nml Nml Nml Nml 0 Nml Nml     Nerve Conduction Studies Anti Sensory Left/Right Comparison   Stim Site L Lat (ms) R Lat (ms) L-R Lat (ms) L Amp (V) R Amp (V) L-R Amp (%) Site1 Site2 L Vel (m/s) R Vel (m/s) L-R Vel (m/s)  Median Acr Palm Anti Sensory (2nd Digit)  31.3C  Wrist 3.1 3.3 0.2 20.8 23.6 11.9 Wrist Palm     Palm 1.5 1.7 0.2 19.6 24.3 19.3       Radial Anti Sensory (Base 1st Digit)  30.7C  Wrist 1.8 1.8 0.0 27.6 40.4 31.7 Wrist Base 1st Digit     Ulnar Anti Sensory (5th Digit)  31.6C  Wrist 3.2 3.0 0.2 *11.3 31.4 64.0 Wrist 5th Digit 44 47 3   Motor Left/Right Comparison   Stim Site L Lat (ms) R Lat (ms) L-R Lat (ms) L Amp (mV) R Amp (mV) L-R Amp (%) Site1 Site2 L Vel (m/s) R Vel (m/s) L-R Vel (m/s)  Median Motor (Abd Poll Brev)  30.9C  Wrist 3.0 3.0 0.0 10.5 9.1 13.3 Elbow Wrist 58 56 2  Elbow 7.0 7.3 0.3 10.1 3.6 64.4       Ulnar Motor (Abd Dig Min)  31.3C  Wrist 2.8 2.7 0.1 3.6 9.3 *61.3 B  Elbow Wrist 59 61 2  B Elbow 6.3 6.4 0.1 9.7 9.5 2.1 A Elbow B Elbow 100 109 9  A Elbow 7.4 7.5 0.1 10.0 9.8 2.0          Waveforms:                      Clinical History: MRI CERVICAL SPINE WITHOUT CONTRAST   TECHNIQUE: Multiplanar, multisequence MR  imaging of the cervical spine was performed. No intravenous contrast was administered.   COMPARISON:  Prior radiograph from 04/28/2021.   FINDINGS: Alignment: Straightening of the normal cervical lordosis. No listhesis.   Vertebrae: Vertebral body height maintained without acute or chronic fracture. Bone marrow signal intensity within normal limits. No discrete or worrisome osseous lesions. No abnormal marrow edema.   Cord: Normal signal and morphology.   Posterior Fossa, vertebral arteries, paraspinal tissues: Unremarkable.   Disc levels:   C2-C3: Mild disc bulge with uncovertebral spurring. No spinal stenosis. Foramina remain patent.   C3-C4: Small central disc protrusion indents the ventral thecal sac (series 5, image 10). Mild spinal stenosis with minimal flattening of the ventral cord, but no cord signal changes. Foramina remain patent.   C4-C5: Broad-based left eccentric disc osteophyte complex. Flattening and partial effacement of the ventral thecal sac, worse on the left. Mild spinal stenosis with moderate left C5 foraminal narrowing. Right neural foramen remains patent.   C5-C6: Disc bulge with bilateral uncovertebral spurring. Flattening and partial effacement of the ventral thecal sac with no more than mild spinal stenosis. Mild to moderate right C6 foraminal narrowing. Left neural foramen remains patent.   C6-C7: Minimal disc bulge with right-sided uncovertebral spurring. No spinal stenosis. Moderate right C7 foraminal narrowing. Left neural foramen is patent.   C7-T1: Seen only on sagittal projection. Grossly unremarkable. No stenosis.   Visualized upper thoracic spine demonstrates no significant finding.   IMPRESSION: 1. Left eccentric disc osteophyte complex at C4-5 with resultant mild spinal stenosis, with moderate left C5 foraminal narrowing. 2. Small central disc protrusion at C3-4 with resultant mild spinal stenosis. 3. Disc bulging with  uncovertebral spurring at C5-6 with resultant mild spinal stenosis, with mild to moderate right C6 foraminal narrowing. 4. Disc bulge with uncovertebral spurring at C6-7 with resultant moderate right C7 foraminal stenosis.     Electronically Signed   By: Rise Mu M.D.   On: 09/23/2021 16:33   He reports that he has never smoked. He does not have any smokeless tobacco history on file. No results for input(s): "HGBA1C", "LABURIC" in the last 8760 hours.  Objective:  VS:  HT:    WT:   BMI:     BP:   HR: bpm  TEMP: ( )  RESP:  Physical Exam  Ortho Exam  Imaging: No results found.  Past Medical/Family/Surgical/Social History: Medications & Allergies reviewed per EMR, new medications updated. Patient Active Problem List   Diagnosis Date Noted   Tendonitis, Achilles, right 12/07/2022   Cervical radiculopathy 10/01/2021   Lateral epicondylitis, right elbow 06/19/2020   Bilateral carpal tunnel syndrome 09/22/2018   Degeneration of lumbar intervertebral disc 08/15/2017   Pain in thoracic spine 02/14/2017   Rotator cuff syndrome 02/14/2017   Neck pain 12/25/2016   Past Medical History:  Diagnosis Date   Hypertension    Obesity    Renal disorder    Family History  Problem Relation Age of Onset   Cancer Mother    Cancer Father    Hypertension Other  Diabetes Other    Obesity Other    Heart attack Other    History reviewed. No pertinent surgical history. Social History   Occupational History   Not on file  Tobacco Use   Smoking status: Never   Smokeless tobacco: Not on file  Substance and Sexual Activity   Alcohol use: Yes   Drug use: Not on file   Sexual activity: Not on file

## 2022-12-19 NOTE — Procedures (Unsigned)
EMG & NCV Findings: Evaluation of the left ulnar sensory nerve showed reduced amplitude (11.3 V).  All remaining nerves (as indicated in the following tables) were within normal limits.  Left vs. Right side comparison data for the ulnar motor nerve indicates abnormal L-R amplitude difference (61.3 %).  All remaining left vs. right side differences were within normal limits.    All examined muscles (as indicated in the following table) showed no evidence of electrical instability.    Impression: Essentially NORMAL electrodiagnostic study of both upper limbs.  There is no significant electrodiagnostic evidence of nerve entrapment, brachial plexopathy or cervical radiculopathy.    As you know, purely sensory or demyelinating radiculopathies and chemical radiculitis may not be detected with this particular electrodiagnostic study.  Recommendations: 1.  Follow-up with referring physician. 2.  Continue current management of symptoms.  ___________________________ Donald Gates FAAPMR Board Certified, American Board of Physical Medicine and Rehabilitation    Nerve Conduction Studies Anti Sensory Summary Table   Stim Site NR Peak (ms) Norm Peak (ms) P-T Amp (V) Norm P-T Amp Site1 Site2 Delta-P (ms) Dist (cm) Vel (m/s) Norm Vel (m/s)  Left Median Acr Palm Anti Sensory (2nd Digit)  31.3C  Wrist    3.1 <3.6 20.8 >10 Wrist Palm 1.6 0.0    Palm    1.5 <2.0 19.6         Right Median Acr Palm Anti Sensory (2nd Digit)  31.7C  Wrist    3.3 <3.6 23.6 >10 Wrist Palm 1.6 0.0    Palm    1.7 <2.0 24.3         Left Radial Anti Sensory (Base 1st Digit)  30.7C  Wrist    1.8 <3.1 27.6  Wrist Base 1st Digit 1.8 0.0    Right Radial Anti Sensory (Base 1st Digit)  31.4C  Wrist    1.8 <3.1 40.4  Wrist Base 1st Digit 1.8 0.0    Left Ulnar Anti Sensory (5th Digit)  31.6C  Wrist    3.2 <3.7 *11.3 >15.0 Wrist 5th Digit 3.2 14.0 44 >38  Right Ulnar Anti Sensory (5th Digit)  32.1C  Wrist    3.0 <3.7 31.4 >15.0  Wrist 5th Digit 3.0 14.0 47 >38   Motor Summary Table   Stim Site NR Onset (ms) Norm Onset (ms) O-P Amp (mV) Norm O-P Amp Site1 Site2 Delta-0 (ms) Dist (cm) Vel (m/s) Norm Vel (m/s)  Left Median Motor (Abd Poll Brev)  30.9C  Wrist    3.0 <4.2 10.5 >5 Elbow Wrist 4.0 23.0 58 >50  Elbow    7.0  10.1         Right Median Motor (Abd Poll Brev)  31.7C  Wrist    3.0 <4.2 9.1 >5 Elbow Wrist 4.3 24.0 56 >50  Elbow    7.3  3.6         Left Ulnar Motor (Abd Dig Min)  31.3C  Wrist    2.8 <4.2 3.6 >3 B Elbow Wrist 3.5 20.5 59 >53  B Elbow    6.3  9.7  A Elbow B Elbow 1.1 11.0 100 >53  A Elbow    7.4  10.0         Right Ulnar Motor (Abd Dig Min)  31.7C  Wrist    2.7 <4.2 9.3 >3 B Elbow Wrist 3.7 22.5 61 >53  B Elbow    6.4  9.5  A Elbow B Elbow 1.1 12.0 109 >53  A Elbow  7.5  9.8          EMG   Side Muscle Nerve Root Ins Act Fibs Psw Amp Dur Poly Recrt Int Dennie Bible Comment  Right 1stDorInt Ulnar C8-T1 Nml Nml Nml Nml Nml 0 Nml Nml   Right Abd Poll Brev Median C8-T1 Nml Nml Nml Nml Nml 0 Nml Nml   Right ExtDigCom   Nml Nml Nml Nml Nml 0 Nml Nml   Right Triceps Radial C6-7-8 Nml Nml Nml Nml Nml 0 Nml Nml   Right Deltoid Axillary C5-6 Nml Nml Nml Nml Nml 0 Nml Nml   Left 1stDorInt Ulnar C8-T1 Nml Nml Nml Nml Nml 0 Nml Nml   Left Abd Poll Brev Median C8-T1 Nml Nml Nml Nml Nml 0 Nml Nml   Left ExtDigCom   Nml Nml Nml Nml Nml 0 Nml Nml   Left Triceps Radial C6-7-8 Nml Nml Nml Nml Nml 0 Nml Nml   Left Deltoid Axillary C5-6 Nml Nml Nml Nml Nml 0 Nml Nml     Nerve Conduction Studies Anti Sensory Left/Right Comparison   Stim Site L Lat (ms) R Lat (ms) L-R Lat (ms) L Amp (V) R Amp (V) L-R Amp (%) Site1 Site2 L Vel (m/s) R Vel (m/s) L-R Vel (m/s)  Median Acr Palm Anti Sensory (2nd Digit)  31.3C  Wrist 3.1 3.3 0.2 20.8 23.6 11.9 Wrist Palm     Palm 1.5 1.7 0.2 19.6 24.3 19.3       Radial Anti Sensory (Base 1st Digit)  30.7C  Wrist 1.8 1.8 0.0 27.6 40.4 31.7 Wrist Base 1st Digit     Ulnar Anti  Sensory (5th Digit)  31.6C  Wrist 3.2 3.0 0.2 *11.3 31.4 64.0 Wrist 5th Digit 44 47 3   Motor Left/Right Comparison   Stim Site L Lat (ms) R Lat (ms) L-R Lat (ms) L Amp (mV) R Amp (mV) L-R Amp (%) Site1 Site2 L Vel (m/s) R Vel (m/s) L-R Vel (m/s)  Median Motor (Abd Poll Brev)  30.9C  Wrist 3.0 3.0 0.0 10.5 9.1 13.3 Elbow Wrist 58 56 2  Elbow 7.0 7.3 0.3 10.1 3.6 64.4       Ulnar Motor (Abd Dig Min)  31.3C  Wrist 2.8 2.7 0.1 3.6 9.3 *61.3 B Elbow Wrist 59 61 2  B Elbow 6.3 6.4 0.1 9.7 9.5 2.1 A Elbow B Elbow 100 109 9  A Elbow 7.4 7.5 0.1 10.0 9.8 2.0          Waveforms:

## 2023-01-14 ENCOUNTER — Encounter: Payer: Self-pay | Admitting: Physician Assistant

## 2023-01-14 ENCOUNTER — Ambulatory Visit: Payer: BC Managed Care – PPO | Admitting: Physician Assistant

## 2023-01-14 DIAGNOSIS — M7661 Achilles tendinitis, right leg: Secondary | ICD-10-CM

## 2023-01-14 NOTE — Progress Notes (Signed)
Office Visit Note   Patient: Donald Gates           Date of Birth: 22-Mar-1975           MRN: [id number] Visit Date: 01/14/2023              Requested by: Jupiter Boys, [place], [place] [address] [place],  [place] [address] PCP: Patient, No Pcp Per  Chief Complaint  Patient presents with   Right Ankle - Follow-up      HPI: Patient is a pleasant 47 year old gentleman who comes in follow-up for his Achilles insertional tendinitis.  He did started physical therapy and has had been using a cam walker boot.  He does feel little bit better but would like to wean out of the boot if possible.  He is only done 1 PT session so far but has more scheduled  Assessment & Plan: Visit Diagnoses: Right Achilles tendinitis  Plan: I would like for him to maximize physical therapy as he has tried anti-inflammatories and immobilization will follow-up with me in a month  Follow-Up Instructions: 1 month  Ortho Exam  Patient is alert, oriented, no adenopathy, well-dressed, normal affect, normal respiratory effort. Right he heel he is neurovascularly intact he has good dorsiflexion plantarflexion eversion inversion some tenderness over the retrocalcaneal bursa neurovascular intact  Imaging: No results found. No images are attached to the encounter.  Labs: Lab Results  Component Value Date   REPTSTATUS 07/16/2014 FINAL 07/12/2014   CULT  07/12/2014    ESCHERICHIA COLI Performed at [facility]    LABORGA ESCHERICHIA COLI 07/12/2014     Lab Results  Component Value Date   ALBUMIN 4.5 07/12/2014    No results found for: "MG" No results found for: "VD25OH"  No results found for: "PREALBUMIN"    Latest Ref Rng & Units 07/12/2014   10:23 PM 09/16/2009   11:04 AM  CBC EXTENDED  WBC 4.0 - 10.5 K/uL 17.1  8.9   RBC 4.22 - 5.81 MIL/uL 4.98  5.20   Hemoglobin 13.0 - 17.0 g/dL 42.5  95.6   HCT 38.7 - 52.0 % 44.2  46.1   Platelets 150 - 400 K/uL 248  216   NEUT# 1.7 - 7.7 K/uL   4.6   Lymph# 0.7 - 4.0 K/uL  3.1      There is no height or weight on file to calculate BMI.  Orders:  No orders of the defined types were placed in this encounter.  No orders of the defined types were placed in this encounter.    Procedures: No procedures performed  Clinical Data: No additional findings.  ROS:  All other systems negative, except as noted in the HPI. Review of Systems  Objective: Vital Signs: There were no vitals taken for this visit.  Specialty Comments:  MRI CERVICAL SPINE WITHOUT CONTRAST   TECHNIQUE: Multiplanar, multisequence MR imaging of the cervical spine was performed. No intravenous contrast was administered.   COMPARISON:  Prior radiograph from 04/28/2021.   FINDINGS: Alignment: Straightening of the normal cervical lordosis. No listhesis.   Vertebrae: Vertebral body height maintained without acute or chronic fracture. Bone marrow signal intensity within normal limits. No discrete or worrisome osseous lesions. No abnormal marrow edema.   Cord: Normal signal and morphology.   Posterior Fossa, vertebral arteries, paraspinal tissues: Unremarkable.   Disc levels:   C2-C3: Mild disc bulge with uncovertebral spurring. No spinal stenosis. Foramina remain patent.   C3-C4: Small central disc  protrusion indents the ventral thecal sac (series 5, image 10). Mild spinal stenosis with minimal flattening of the ventral cord, but no cord signal changes. Foramina remain patent.   C4-C5: Broad-based left eccentric disc osteophyte complex. Flattening and partial effacement of the ventral thecal sac, worse on the left. Mild spinal stenosis with moderate left C5 foraminal narrowing. Right neural foramen remains patent.   C5-C6: Disc bulge with bilateral uncovertebral spurring. Flattening and partial effacement of the ventral thecal sac with no more than mild spinal stenosis. Mild to moderate right C6 foraminal narrowing. Left neural foramen  remains patent.   C6-C7: Minimal disc bulge with right-sided uncovertebral spurring. No spinal stenosis. Moderate right C7 foraminal narrowing. Left neural foramen is patent.   C7-T1: Seen only on sagittal projection. Grossly unremarkable. No stenosis.   Visualized upper thoracic spine demonstrates no significant finding.   IMPRESSION: 1. Left eccentric disc osteophyte complex at C4-5 with resultant mild spinal stenosis, with moderate left C5 foraminal narrowing. 2. Small central disc protrusion at C3-4 with resultant mild spinal stenosis. 3. Disc bulging with uncovertebral spurring at C5-6 with resultant mild spinal stenosis, with mild to moderate right C6 foraminal narrowing. 4. Disc bulge with uncovertebral spurring at C6-7 with resultant moderate right C7 foraminal stenosis.     Electronically Signed   By: Rise Mu M.D.   On: 09/23/2021 16:33  PMFS History: Patient Active Problem List   Diagnosis Date Noted   Tendonitis, Achilles, right 12/07/2022   Cervical radiculopathy 10/01/2021   Lateral epicondylitis, right elbow 06/19/2020   Bilateral carpal tunnel syndrome 09/22/2018   Degeneration of lumbar intervertebral disc 08/15/2017   Pain in thoracic spine 02/14/2017   Rotator cuff syndrome 02/14/2017   Neck pain 12/25/2016   Past Medical History:  Diagnosis Date   Hypertension    Obesity    Renal disorder     Family History  Problem Relation Age of Onset   Cancer Mother    Cancer Father    Hypertension Other    Diabetes Other    Obesity Other    Heart attack Other     No past surgical history on file. Social History   Occupational History   Not on file  Tobacco Use   Smoking status: Never   Smokeless tobacco: Not on file  Substance and Sexual Activity   Alcohol use: Yes   Drug use: Not on file   Sexual activity: Not on file

## 2023-03-07 ENCOUNTER — Encounter: Payer: Self-pay | Admitting: Physician Assistant

## 2023-03-07 ENCOUNTER — Ambulatory Visit: Payer: 59 | Admitting: Physician Assistant

## 2023-03-07 DIAGNOSIS — M25571 Pain in right ankle and joints of right foot: Secondary | ICD-10-CM | POA: Diagnosis not present

## 2023-03-07 NOTE — Progress Notes (Signed)
Office Visit Note   Patient: Donald Gates           Date of Birth: 1975/06/29           MRN: [id number] Visit Date: 03/07/2023              Requested by: No referring provider defined for this encounter. PCP: Patient, No Pcp Per  Chief Complaint  Patient presents with   Right Foot - Follow-up      HPI: Patient is a pleasant 48 year old gentleman with I been following for right posterior ankle pain.  He now says the pain is more on the inside of his foot.  He has been taking Advil as needed.  He notices the right ankle pain when standing or going up and down stairs.   Assessment & Plan: Visit Diagnoses:  1. Pain in right ankle and joints of right foot     Plan: Right posterior and medial ankle pain.  He seems to be having more medial ankle pain today though no loss of arch.  No lateral impingement findings.  This is now been going on for 8 weeks.  He has been doing physical therapy and being compliant with [place] physical therapy.  Because of this and lack of significant improvement will order an MRI.  Will also provide him with an ASO brace which she can use in a regular shoe with an arch support  Follow-Up Instructions: Return Will call patient with MRI results.   Ortho Exam  Patient is alert, oriented, no adenopathy, well-dressed, normal affect, normal respiratory effort. Examination was a right ankle no redness no erythema no lateral impingement he has good dorsiflexion plantarflexion eversion and inversion he does seem to be a little bit tender along the posterior tibial tendon.  Also still tender at the insertion of the Achilles.  Neurovascular intact no signs of infection  Imaging: No results found. No images are attached to the encounter.  Labs: Lab Results  Component Value Date   REPTSTATUS 07/16/2014 FINAL 07/12/2014   CULT  07/12/2014    ESCHERICHIA COLI Performed at [facility]    LABORGA ESCHERICHIA COLI 07/12/2014     Lab Results   Component Value Date   ALBUMIN 4.5 07/12/2014    No results found for: "MG" No results found for: "VD25OH"  No results found for: "PREALBUMIN"    Latest Ref Rng & Units 07/12/2014   10:23 PM 09/16/2009   11:04 AM  CBC EXTENDED  WBC 4.0 - 10.5 K/uL 17.1  8.9   RBC 4.22 - 5.81 MIL/uL 4.98  5.20   Hemoglobin 13.0 - 17.0 g/dL 78.2  95.6   HCT 21.3 - 52.0 % 44.2  46.1   Platelets 150 - 400 K/uL 248  216   NEUT# 1.7 - 7.7 K/uL  4.6   Lymph# 0.7 - 4.0 K/uL  3.1      There is no height or weight on file to calculate BMI.  Orders:  No orders of the defined types were placed in this encounter.  No orders of the defined types were placed in this encounter.    Procedures: No procedures performed  Clinical Data: No additional findings.  ROS:  All other systems negative, except as noted in the HPI. Review of Systems  Objective: Vital Signs: There were no vitals taken for this visit.  Specialty Comments:  MRI CERVICAL SPINE WITHOUT CONTRAST   TECHNIQUE: Multiplanar, multisequence MR imaging of the cervical spine was performed.  No intravenous contrast was administered.   COMPARISON:  Prior radiograph from 04/28/2021.   FINDINGS: Alignment: Straightening of the normal cervical lordosis. No listhesis.   Vertebrae: Vertebral body height maintained without acute or chronic fracture. Bone marrow signal intensity within normal limits. No discrete or worrisome osseous lesions. No abnormal marrow edema.   Cord: Normal signal and morphology.   Posterior Fossa, vertebral arteries, paraspinal tissues: Unremarkable.   Disc levels:   C2-C3: Mild disc bulge with uncovertebral spurring. No spinal stenosis. Foramina remain patent.   C3-C4: Small central disc protrusion indents the ventral thecal sac (series 5, image 10). Mild spinal stenosis with minimal flattening of the ventral cord, but no cord signal changes. Foramina remain patent.   C4-C5: Broad-based left eccentric  disc osteophyte complex. Flattening and partial effacement of the ventral thecal sac, worse on the left. Mild spinal stenosis with moderate left C5 foraminal narrowing. Right neural foramen remains patent.   C5-C6: Disc bulge with bilateral uncovertebral spurring. Flattening and partial effacement of the ventral thecal sac with no more than mild spinal stenosis. Mild to moderate right C6 foraminal narrowing. Left neural foramen remains patent.   C6-C7: Minimal disc bulge with right-sided uncovertebral spurring. No spinal stenosis. Moderate right C7 foraminal narrowing. Left neural foramen is patent.   C7-T1: Seen only on sagittal projection. Grossly unremarkable. No stenosis.   Visualized upper thoracic spine demonstrates no significant finding.   IMPRESSION: 1. Left eccentric disc osteophyte complex at C4-5 with resultant mild spinal stenosis, with moderate left C5 foraminal narrowing. 2. Small central disc protrusion at C3-4 with resultant mild spinal stenosis. 3. Disc bulging with uncovertebral spurring at C5-6 with resultant mild spinal stenosis, with mild to moderate right C6 foraminal narrowing. 4. Disc bulge with uncovertebral spurring at C6-7 with resultant moderate right C7 foraminal stenosis.     Electronically Signed   By: Rise Mu M.D.   On: 09/23/2021 16:33  PMFS History: Patient Active Problem List   Diagnosis Date Noted   Tendonitis, Achilles, right 12/07/2022   Cervical radiculopathy 10/01/2021   Lateral epicondylitis, right elbow 06/19/2020   Bilateral carpal tunnel syndrome 09/22/2018   Degeneration of lumbar intervertebral disc 08/15/2017   Pain in thoracic spine 02/14/2017   Rotator cuff syndrome 02/14/2017   Neck pain 12/25/2016   Past Medical History:  Diagnosis Date   Hypertension    Obesity    Renal disorder     Family History  Problem Relation Age of Onset   Cancer Mother    Cancer Father    Hypertension Other    Diabetes  Other    Obesity Other    Heart attack Other     History reviewed. No pertinent surgical history. Social History   Occupational History   Not on file  Tobacco Use   Smoking status: Never   Smokeless tobacco: Not on file  Substance and Sexual Activity   Alcohol use: Yes   Drug use: Not on file   Sexual activity: Not on file

## 2023-03-23 ENCOUNTER — Encounter: Payer: Self-pay | Admitting: Physician Assistant

## 2023-03-23 ENCOUNTER — Ambulatory Visit: Payer: 59 | Admitting: Physician Assistant

## 2023-03-23 ENCOUNTER — Other Ambulatory Visit (INDEPENDENT_AMBULATORY_CARE_PROVIDER_SITE_OTHER): Payer: Self-pay

## 2023-03-23 DIAGNOSIS — M7662 Achilles tendinitis, left leg: Secondary | ICD-10-CM | POA: Insufficient documentation

## 2023-03-23 DIAGNOSIS — M25572 Pain in left ankle and joints of left foot: Secondary | ICD-10-CM | POA: Insufficient documentation

## 2023-03-23 NOTE — Progress Notes (Signed)
Office Visit Note   Patient: Donald Gates           Date of Birth: February 15, 1975           MRN: [id number] Visit Date: 03/23/2023              Requested by: No referring provider defined for this encounter. PCP: Patient, No Pcp Per   Assessment & Plan: Visit Diagnoses:  1. Pain in left ankle and joints of left foot   2. Achilles tendinitis of left lower extremity     Plan: Fran Lowes is a pleasant 48 year old gentleman who I treated for right Achilles insertional tendinitis in the past.  He comes in today with similar symptoms on the left.  He denies any particular injury but this got significant enough over the weekend that he had quite a bit of swelling and pain.  He did have his cam walker boot from the other side and this placed it on his ankle.  He says he has gotten a little bit better.  Will go forward and give him a new heel lift for the boot would like for him to remain in this for the next couple weeks can come back at that time could talk about bracing and continuing physical therapy.  Briefly talked about being referred to discuss surgical options but he is not interested in this at this time.  Follow-Up Instructions: Return in about 2 weeks (around 04/06/2023).   Orders:  Orders Placed This Encounter  Procedures   XR Ankle Complete Left   No orders of the defined types were placed in this encounter.     Procedures: No procedures performed   Clinical Data: No additional findings.   Subjective: Chief Complaint  Patient presents with   Left Ankle - Pain    HPI Pleasant 48 year old gentleman comes in today in follow-up for right ankle Achilles insertional tendinitis which is doing quite better with the brace.  Now has new onset of symptoms on the left side in the back of the ankle.  Denies any injuries or bruising.  This started happening over the weekend.  He does have swelling Review of Systems  All other systems reviewed and are  negative.    Objective: Vital Signs: There were no vitals taken for this visit.  Physical Exam Constitutional:      Appearance: Normal appearance.  Pulmonary:     Effort: Pulmonary effort is normal.  Skin:    General: Skin is warm and dry.  Neurological:     General: No focal deficit present.     Mental Status: He is alert and oriented to person, place, and time.  Psychiatric:        Mood and Affect: Mood normal.        Behavior: Behavior normal.     Ortho Exam Left ankle he has a palpable pulse no cellulitis no erythema he is have active dorsiflexion plantarflexion eversion inversion he does have some posterior ankle swelling Achilles is intact he does have tenderness at the Achilles junction with the posterior calcaneus Specialty Comments:  MRI CERVICAL SPINE WITHOUT CONTRAST   TECHNIQUE: Multiplanar, multisequence MR imaging of the cervical spine was performed. No intravenous contrast was administered.   COMPARISON:  Prior radiograph from 04/28/2021.   FINDINGS: Alignment: Straightening of the normal cervical lordosis. No listhesis.   Vertebrae: Vertebral body height maintained without acute or chronic fracture. Bone marrow signal intensity within normal limits. No discrete or worrisome osseous lesions.  No abnormal marrow edema.   Cord: Normal signal and morphology.   Posterior Fossa, vertebral arteries, paraspinal tissues: Unremarkable.   Disc levels:   C2-C3: Mild disc bulge with uncovertebral spurring. No spinal stenosis. Foramina remain patent.   C3-C4: Small central disc protrusion indents the ventral thecal sac (series 5, image 10). Mild spinal stenosis with minimal flattening of the ventral cord, but no cord signal changes. Foramina remain patent.   C4-C5: Broad-based left eccentric disc osteophyte complex. Flattening and partial effacement of the ventral thecal sac, worse on the left. Mild spinal stenosis with moderate left C5  foraminal narrowing. Right neural foramen remains patent.   C5-C6: Disc bulge with bilateral uncovertebral spurring. Flattening and partial effacement of the ventral thecal sac with no more than mild spinal stenosis. Mild to moderate right C6 foraminal narrowing. Left neural foramen remains patent.   C6-C7: Minimal disc bulge with right-sided uncovertebral spurring. No spinal stenosis. Moderate right C7 foraminal narrowing. Left neural foramen is patent.   C7-T1: Seen only on sagittal projection. Grossly unremarkable. No stenosis.   Visualized upper thoracic spine demonstrates no significant finding.   IMPRESSION: 1. Left eccentric disc osteophyte complex at C4-5 with resultant mild spinal stenosis, with moderate left C5 foraminal narrowing. 2. Small central disc protrusion at C3-4 with resultant mild spinal stenosis. 3. Disc bulging with uncovertebral spurring at C5-6 with resultant mild spinal stenosis, with mild to moderate right C6 foraminal narrowing. 4. Disc bulge with uncovertebral spurring at C6-7 with resultant moderate right C7 foraminal stenosis.     Electronically Signed   By: Rise Mu M.D.   On: 09/23/2021 16:33  Imaging: No results found.   PMFS History: Patient Active Problem List   Diagnosis Date Noted   Pain in left ankle and joints of left foot 03/23/2023   Achilles tendinitis of left lower extremity 03/23/2023   Right ankle pain 03/07/2023   Tendonitis, Achilles, right 12/07/2022   Cervical radiculopathy 10/01/2021   Lateral epicondylitis, right elbow 06/19/2020   Bilateral carpal tunnel syndrome 09/22/2018   Degeneration of lumbar intervertebral disc 08/15/2017   Pain in thoracic spine 02/14/2017   Rotator cuff syndrome 02/14/2017   Neck pain 12/25/2016   Past Medical History:  Diagnosis Date   Hypertension    Obesity    Renal disorder     Family History  Problem Relation Age of Onset   Cancer Mother    Cancer Father     Hypertension Other    Diabetes Other    Obesity Other    Heart attack Other     History reviewed. No pertinent surgical history. Social History   Occupational History   Not on file  Tobacco Use   Smoking status: Never   Smokeless tobacco: Not on file  Substance and Sexual Activity   Alcohol use: Yes   Drug use: Not on file   Sexual activity: Not on file

## 2023-03-23 NOTE — Progress Notes (Signed)
Office Visit Note   Patient: Donald Gates           Date of Birth: 1976/01/21           MRN: [id number] Visit Date: 03/23/2023              Requested by: No referring provider defined for this encounter. PCP: Patient, No Pcp Per   Assessment & Plan: Visit Diagnoses:  1. Pain in left ankle and joints of left foot     Plan:   Follow-Up Instructions: No follow-ups on file.   Orders:  No orders of the defined types were placed in this encounter.  No orders of the defined types were placed in this encounter.     Procedures: No procedures performed   Clinical Data: No additional findings.   Subjective: No chief complaint on file.   HPI  Review of Systems  All other systems reviewed and are negative.    Objective: Vital Signs: There were no vitals taken for this visit.  Physical Exam Constitutional:      Appearance: Normal appearance.  Pulmonary:     Effort: Pulmonary effort is normal.  Skin:    General: Skin is warm and dry.  Neurological:     General: No focal deficit present.     Mental Status: He is alert and oriented to person, place, and time.  Psychiatric:        Mood and Affect: Mood normal.        Behavior: Behavior normal.     Ortho Exam  Specialty Comments:  MRI CERVICAL SPINE WITHOUT CONTRAST   TECHNIQUE: Multiplanar, multisequence MR imaging of the cervical spine was performed. No intravenous contrast was administered.   COMPARISON:  Prior radiograph from 04/28/2021.   FINDINGS: Alignment: Straightening of the normal cervical lordosis. No listhesis.   Vertebrae: Vertebral body height maintained without acute or chronic fracture. Bone marrow signal intensity within normal limits. No discrete or worrisome osseous lesions. No abnormal marrow edema.   Cord: Normal signal and morphology.   Posterior Fossa, vertebral arteries, paraspinal tissues: Unremarkable.   Disc levels:   C2-C3: Mild disc bulge with uncovertebral  spurring. No spinal stenosis. Foramina remain patent.   C3-C4: Small central disc protrusion indents the ventral thecal sac (series 5, image 10). Mild spinal stenosis with minimal flattening of the ventral cord, but no cord signal changes. Foramina remain patent.   C4-C5: Broad-based left eccentric disc osteophyte complex. Flattening and partial effacement of the ventral thecal sac, worse on the left. Mild spinal stenosis with moderate left C5 foraminal narrowing. Right neural foramen remains patent.   C5-C6: Disc bulge with bilateral uncovertebral spurring. Flattening and partial effacement of the ventral thecal sac with no more than mild spinal stenosis. Mild to moderate right C6 foraminal narrowing. Left neural foramen remains patent.   C6-C7: Minimal disc bulge with right-sided uncovertebral spurring. No spinal stenosis. Moderate right C7 foraminal narrowing. Left neural foramen is patent.   C7-T1: Seen only on sagittal projection. Grossly unremarkable. No stenosis.   Visualized upper thoracic spine demonstrates no significant finding.   IMPRESSION: 1. Left eccentric disc osteophyte complex at C4-5 with resultant mild spinal stenosis, with moderate left C5 foraminal narrowing. 2. Small central disc protrusion at C3-4 with resultant mild spinal stenosis. 3. Disc bulging with uncovertebral spurring at C5-6 with resultant mild spinal stenosis, with mild to moderate right C6 foraminal narrowing. 4. Disc bulge with uncovertebral spurring at C6-7 with resultant moderate right C7  foraminal stenosis.     Electronically Signed   By: Rise Mu M.D.   On: 09/23/2021 16:33  Imaging: No results found.   PMFS History: Patient Active Problem List   Diagnosis Date Noted   Pain in left ankle and joints of left foot 03/23/2023   Right ankle pain 03/07/2023   Tendonitis, Achilles, right 12/07/2022   Cervical radiculopathy 10/01/2021   Lateral epicondylitis, right  elbow 06/19/2020   Bilateral carpal tunnel syndrome 09/22/2018   Degeneration of lumbar intervertebral disc 08/15/2017   Pain in thoracic spine 02/14/2017   Rotator cuff syndrome 02/14/2017   Neck pain 12/25/2016   Past Medical History:  Diagnosis Date   Hypertension    Obesity    Renal disorder     Family History  Problem Relation Age of Onset   Cancer Mother    Cancer Father    Hypertension Other    Diabetes Other    Obesity Other    Heart attack Other     History reviewed. No pertinent surgical history. Social History   Occupational History   Not on file  Tobacco Use   Smoking status: Never   Smokeless tobacco: Not on file  Substance and Sexual Activity   Alcohol use: Yes   Drug use: Not on file   Sexual activity: Not on file

## 2023-03-25 ENCOUNTER — Ambulatory Visit: Payer: 59 | Admitting: Physician Assistant

## 2023-04-13 ENCOUNTER — Ambulatory Visit: Payer: 59 | Admitting: Physician Assistant

## 2023-04-13 DIAGNOSIS — M25572 Pain in left ankle and joints of left foot: Secondary | ICD-10-CM | POA: Diagnosis not present

## 2023-04-13 NOTE — Progress Notes (Signed)
 Office Visit Note   Patient: Donald Gates           Date of Birth: 1975/04/12           MRN: [id number] Visit Date: 04/13/2023              Requested by: No referring provider defined for this encounter. PCP: Patient, No Pcp Per  Chief Complaint  Patient presents with   Left Ankle - Follow-up      HPI: Patient presents today for follow-up in his left ankle pain he has been doing much better he has no complaints.  He is requesting some more heel lifts.  Reports the posterior pain is much better  Assessment & Plan: Visit Diagnoses: Insertional Achilles tendinitis  Plan: Will continue with some heel lifts as needed and issues may follow-up with me as needed  Follow-Up Instructions: No follow-ups on file.   Ortho Exam  Patient is alert, oriented, no adenopathy, well-dressed, normal affect, normal respiratory effort. Lamination no pain on dorsiflexion plantarflexion eversion inversion no swelling compartments are soft and compressible strength is good  Imaging: No results found. No images are attached to the encounter.  Labs: Lab Results  Component Value Date   REPTSTATUS 07/16/2014 FINAL 07/12/2014   CULT  07/12/2014    ESCHERICHIA COLI Performed at [facility]    LABORGA ESCHERICHIA COLI 07/12/2014     Lab Results  Component Value Date   ALBUMIN 4.5 07/12/2014    No results found for: "MG" No results found for: "VD25OH"  No results found for: "PREALBUMIN"    Latest Ref Rng & Units 07/12/2014   10:23 PM 09/16/2009   11:04 AM  CBC EXTENDED  WBC 4.0 - 10.5 K/uL 17.1  8.9   RBC 4.22 - 5.81 MIL/uL 4.98  5.20   Hemoglobin 13.0 - 17.0 g/dL 40.9  81.1   HCT 91.4 - 52.0 % 44.2  46.1   Platelets 150 - 400 K/uL 248  216   NEUT# 1.7 - 7.7 K/uL  4.6   Lymph# 0.7 - 4.0 K/uL  3.1      There is no height or weight on file to calculate BMI.  Orders:  No orders of the defined types were placed in this encounter.  No orders of the defined types  were placed in this encounter.    Procedures: No procedures performed  Clinical Data: No additional findings.  ROS:  All other systems negative, except as noted in the HPI. Review of Systems  Objective: Vital Signs: There were no vitals taken for this visit.  Specialty Comments:  MRI CERVICAL SPINE WITHOUT CONTRAST   TECHNIQUE: Multiplanar, multisequence MR imaging of the cervical spine was performed. No intravenous contrast was administered.   COMPARISON:  Prior radiograph from 04/28/2021.   FINDINGS: Alignment: Straightening of the normal cervical lordosis. No listhesis.   Vertebrae: Vertebral body height maintained without acute or chronic fracture. Bone marrow signal intensity within normal limits. No discrete or worrisome osseous lesions. No abnormal marrow edema.   Cord: Normal signal and morphology.   Posterior Fossa, vertebral arteries, paraspinal tissues: Unremarkable.   Disc levels:   C2-C3: Mild disc bulge with uncovertebral spurring. No spinal stenosis. Foramina remain patent.   C3-C4: Small central disc protrusion indents the ventral thecal sac (series 5, image 10). Mild spinal stenosis with minimal flattening of the ventral cord, but no cord signal changes. Foramina remain patent.   C4-C5: Broad-based left eccentric disc osteophyte complex. Flattening  and partial effacement of the ventral thecal sac, worse on the left. Mild spinal stenosis with moderate left C5 foraminal narrowing. Right neural foramen remains patent.   C5-C6: Disc bulge with bilateral uncovertebral spurring. Flattening and partial effacement of the ventral thecal sac with no more than mild spinal stenosis. Mild to moderate right C6 foraminal narrowing. Left neural foramen remains patent.   C6-C7: Minimal disc bulge with right-sided uncovertebral spurring. No spinal stenosis. Moderate right C7 foraminal narrowing. Left neural foramen is patent.   C7-T1: Seen only on  sagittal projection. Grossly unremarkable. No stenosis.   Visualized upper thoracic spine demonstrates no significant finding.   IMPRESSION: 1. Left eccentric disc osteophyte complex at C4-5 with resultant mild spinal stenosis, with moderate left C5 foraminal narrowing. 2. Small central disc protrusion at C3-4 with resultant mild spinal stenosis. 3. Disc bulging with uncovertebral spurring at C5-6 with resultant mild spinal stenosis, with mild to moderate right C6 foraminal narrowing. 4. Disc bulge with uncovertebral spurring at C6-7 with resultant moderate right C7 foraminal stenosis.     Electronically Signed   By: Rise Mu M.D.   On: 09/23/2021 16:33  PMFS History: Patient Active Problem List   Diagnosis Date Noted   Pain in left ankle and joints of left foot 03/23/2023   Achilles tendinitis of left lower extremity 03/23/2023   Right ankle pain 03/07/2023   Tendonitis, Achilles, right 12/07/2022   Cervical radiculopathy 10/01/2021   Lateral epicondylitis, right elbow 06/19/2020   Bilateral carpal tunnel syndrome 09/22/2018   Degeneration of lumbar intervertebral disc 08/15/2017   Pain in thoracic spine 02/14/2017   Rotator cuff syndrome 02/14/2017   Neck pain 12/25/2016   Past Medical History:  Diagnosis Date   Hypertension    Obesity    Renal disorder     Family History  Problem Relation Age of Onset   Cancer Mother    Cancer Father    Hypertension Other    Diabetes Other    Obesity Other    Heart attack Other     No past surgical history on file. Social History   Occupational History   Not on file  Tobacco Use   Smoking status: Never   Smokeless tobacco: Not on file  Substance and Sexual Activity   Alcohol use: Yes   Drug use: Not on file   Sexual activity: Not on file

## 2023-07-19 ENCOUNTER — Encounter: Payer: Self-pay | Admitting: Physician Assistant

## 2023-07-19 ENCOUNTER — Ambulatory Visit: Admitting: Physician Assistant

## 2023-07-19 DIAGNOSIS — M25571 Pain in right ankle and joints of right foot: Secondary | ICD-10-CM | POA: Diagnosis not present

## 2023-07-19 DIAGNOSIS — M7661 Achilles tendinitis, right leg: Secondary | ICD-10-CM

## 2023-07-19 NOTE — Progress Notes (Signed)
 Office Visit Note   Patient: Donald Gates           Date of Birth: [birth year]           MRN: [id number] Visit Date: 07/19/2023              Requested by: No referring provider defined for this encounter. PCP: Patient, No Pcp Per  No chief complaint on file.     HPI: Patient is a 48 year old gentleman who I have seen and for the last several months for insertional Achilles tendinitis.  This has been going on for 8 or 9 months now.  He has periodically worn a boot with a heel lift.  He also has undergone physical therapy within the last 6 months.  He did have a flareup a couple weeks ago with back into his boot.  No reinjury  Assessment & Plan: Visit Diagnoses:  1. Pain in right ankle and joints of right foot   2. Tendonitis, Achilles, right     Plan: Findings consistent with insertional Achilles tendinitis.  Its now been going on for 8 or 9 months and has done physical therapy within the last 6 months he is currently reimmobilized in a boot for flareup acute but weeks ago.  At this point I would like for him to get an MRI and follow-up with Dr. Julio Ohm he is in agreement with this plan and is requesting an MRI as well  Follow-Up Instructions: With Dr. Julio Ohm after MRI  Ortho Exam  Patient is alert, oriented, no adenopathy, well-dressed, normal affect, normal respiratory effort. Examination of his ankle he is neurovascular intact compartments are soft and nontender he is tender at the distal insertion of the Achilles into the posterior calcaneus but has good plantarflexion and dorsiflexion Achilles is palpable    Imaging: No results found. No images are attached to the encounter.  Labs: Lab Results  Component Value Date   REPTSTATUS 07/16/2014 FINAL 07/12/2014   CULT  07/12/2014    ESCHERICHIA COLI Performed at [facility]    LABORGA ESCHERICHIA COLI 07/12/2014     Lab Results  Component Value Date   ALBUMIN 4.5 07/12/2014    No results found for:  "MG" No results found for: "VD25OH"  No results found for: "PREALBUMIN"    Latest Ref Rng & Units 07/12/2014   10:23 PM 09/16/2009   11:04 AM  CBC EXTENDED  WBC 4.0 - 10.5 K/uL 17.1  8.9   RBC 4.22 - 5.81 MIL/uL 4.98  5.20   Hemoglobin 13.0 - 17.0 g/dL 78.2  95.6   HCT 21.3 - 52.0 % 44.2  46.1   Platelets 150 - 400 K/uL 248  216   NEUT# 1.7 - 7.7 K/uL  4.6   Lymph# 0.7 - 4.0 K/uL  3.1      There is no height or weight on file to calculate BMI.  Orders:  Orders Placed This Encounter  Procedures   MR Ankle Right w/o contrast   No orders of the defined types were placed in this encounter.    Procedures: No procedures performed  Clinical Data: No additional findings.  ROS:  All other systems negative, except as noted in the HPI. Review of Systems  Objective: Vital Signs: There were no vitals taken for this visit.  Specialty Comments:  MRI CERVICAL SPINE WITHOUT CONTRAST   TECHNIQUE: Multiplanar, multisequence MR imaging of the cervical spine was performed. No intravenous contrast was administered.   COMPARISON:  Prior radiograph from 04/28/2021.   FINDINGS: Alignment: Straightening of the normal cervical lordosis. No listhesis.   Vertebrae: Vertebral body height maintained without acute or chronic fracture. Bone marrow signal intensity within normal limits. No discrete or worrisome osseous lesions. No abnormal marrow edema.   Cord: Normal signal and morphology.   Posterior Fossa, vertebral arteries, paraspinal tissues: Unremarkable.   Disc levels:   C2-C3: Mild disc bulge with uncovertebral spurring. No spinal stenosis. Foramina remain patent.   C3-C4: Small central disc protrusion indents the ventral thecal sac (series 5, image 10). Mild spinal stenosis with minimal flattening of the ventral cord, but no cord signal changes. Foramina remain patent.   C4-C5: Broad-based left eccentric disc osteophyte complex. Flattening and partial effacement of the  ventral thecal sac, worse on the left. Mild spinal stenosis with moderate left C5 foraminal narrowing. Right neural foramen remains patent.   C5-C6: Disc bulge with bilateral uncovertebral spurring. Flattening and partial effacement of the ventral thecal sac with no more than mild spinal stenosis. Mild to moderate right C6 foraminal narrowing. Left neural foramen remains patent.   C6-C7: Minimal disc bulge with right-sided uncovertebral spurring. No spinal stenosis. Moderate right C7 foraminal narrowing. Left neural foramen is patent.   C7-T1: Seen only on sagittal projection. Grossly unremarkable. No stenosis.   Visualized upper thoracic spine demonstrates no significant finding.   IMPRESSION: 1. Left eccentric disc osteophyte complex at C4-5 with resultant mild spinal stenosis, with moderate left C5 foraminal narrowing. 2. Small central disc protrusion at C3-4 with resultant mild spinal stenosis. 3. Disc bulging with uncovertebral spurring at C5-6 with resultant mild spinal stenosis, with mild to moderate right C6 foraminal narrowing. 4. Disc bulge with uncovertebral spurring at C6-7 with resultant moderate right C7 foraminal stenosis.     Electronically Signed   By: Virgia Griffins M.D.   On: 09/23/2021 16:33  PMFS History: Patient Active Problem List   Diagnosis Date Noted   Pain in left ankle and joints of left foot 03/23/2023   Achilles tendinitis of left lower extremity 03/23/2023   Right ankle pain 03/07/2023   Tendonitis, Achilles, right 12/07/2022   Cervical radiculopathy 10/01/2021   Lateral epicondylitis, right elbow 06/19/2020   Bilateral carpal tunnel syndrome 09/22/2018   Degeneration of lumbar intervertebral disc 08/15/2017   Pain in thoracic spine 02/14/2017   Rotator cuff syndrome 02/14/2017   Neck pain 12/25/2016   Past Medical History:  Diagnosis Date   Hypertension    Obesity    Renal disorder     Family History  Problem Relation Age  of Onset   Cancer Mother    Cancer Father    Hypertension Other    Diabetes Other    Obesity Other    Heart attack Other     History reviewed. No pertinent surgical history. Social History   Occupational History   Not on file  Tobacco Use   Smoking status: Never   Smokeless tobacco: Not on file  Substance and Sexual Activity   Alcohol use: Yes   Drug use: Not on file   Sexual activity: Not on file

## 2023-07-23 ENCOUNTER — Ambulatory Visit
Admission: RE | Admit: 2023-07-23 | Discharge: 2023-07-23 | Disposition: A | Discharge status: Home / Self Care | Source: Ambulatory Visit | Attending: Physician Assistant

## 2023-07-23 DIAGNOSIS — M25571 Pain in right ankle and joints of right foot: Secondary | ICD-10-CM

## 2023-07-26 ENCOUNTER — Other Ambulatory Visit: Payer: Self-pay | Admitting: Physician Assistant

## 2023-07-26 DIAGNOSIS — M7661 Achilles tendinitis, right leg: Secondary | ICD-10-CM

## 2023-12-12 ENCOUNTER — Encounter: Payer: Self-pay | Admitting: Radiology

## 2023-12-14 ENCOUNTER — Ambulatory Visit: Admitting: Podiatry

## 2023-12-14 DIAGNOSIS — L600 Ingrowing nail: Secondary | ICD-10-CM

## 2023-12-14 NOTE — Progress Notes (Signed)
 Subjective:  Patient ID: Donald Gates, male    DOB: 11/26/1975,  MRN: [id number]  Chief Complaint  Patient presents with   Ingrown Toenail    Ingrown nail left hallux     48 y.o. male presents with the above complaint.  Patient presents with left hallux lateral border ingrown painful to touch is progressive gotten worse worse with ambulation or shoe pressure patient would like to discuss treatment options for this he would like to have it removed has not seen anyone as part of same he denies any other acute complaints.  He agrees with the plan like to proceed with the removal of the ingrown nail.   Review of Systems: Negative except as noted in the HPI. Denies N/V/F/Ch.  Past Medical History:  Diagnosis Date   Hypertension    Obesity    Renal disorder     Current Outpatient Medications:    cyclobenzaprine  (FLEXERIL ) 5 MG tablet, Take 1-2 tablets (5-10 mg total) by mouth 3 (three) times daily as needed for muscle spasms., Disp: 30 tablet, Rfl: 3   ibuprofen  (ADVIL ) 800 MG tablet, Take 1 tablet (800 mg total) by mouth every 8 (eight) hours as needed., Disp: 30 tablet, Rfl: 2   ibuprofen  (ADVIL ,MOTRIN ) 200 MG tablet, Take 400-800 mg by mouth every 6 (six) hours as needed for moderate pain., Disp: , Rfl:    meloxicam  (MOBIC ) 15 MG tablet, Take 1 tablet (15 mg total) by mouth daily., Disp: 30 tablet, Rfl: 0   propranolol (INDERAL) 20 MG tablet, Take 40 mg by mouth daily. , Disp: , Rfl:    tamsulosin (FLOMAX) 0.4 MG CAPS capsule, Take 0.4 mg by mouth., Disp: , Rfl:   Social History   Tobacco Use  Smoking Status Never  Smokeless Tobacco Not on file    Allergies  Allergen Reactions   Milk (Cow) Other (See Comments)    Strep throat    Objective:  There were no vitals filed for this visit. There is no height or weight on file to calculate BMI. Constitutional Well developed. Well nourished.  Vascular Dorsalis pedis pulses palpable bilaterally. Posterior tibial pulses  palpable bilaterally. Capillary refill normal to all digits.  No cyanosis or clubbing noted. Pedal hair growth normal.  Neurologic Normal speech. Oriented to person, place, and time. Epicritic sensation to light touch grossly present bilaterally.  Dermatologic Painful ingrowing nail at lateral nail borders of the hallux nail left. No other open wounds. No skin lesions.  Orthopedic: Normal joint ROM without pain or crepitus bilaterally. No visible deformities. No bony tenderness.   Radiographs: None Assessment:  No diagnosis found. Plan:  Patient was evaluated and treated and all questions answered.  Ingrown Nail, left -Patient elects to proceed with minor surgery to remove ingrown toenail removal today. Consent reviewed and signed by patient. -Ingrown nail excised. See procedure note. -Educated on post-procedure care including soaking. Written instructions provided and reviewed. -Patient to follow up in 2 weeks for nail check.  Procedure: Excision of Ingrown Toenail Location: Left 1st toe lateral nail borders. Anesthesia: Lidocaine 1% plain; 1.5 mL and Marcaine  0.5% plain; 1.5 mL, digital block. Skin Prep: Betadine. Dressing: Silvadene; telfa; dry, sterile, compression dressing. Technique: Following skin prep, the toe was exsanguinated and a tourniquet was secured at the base of the toe. The affected nail border was freed, split with a nail splitter, and excised. Chemical matrixectomy was then performed with phenol and irrigated out with alcohol. The tourniquet was then removed and sterile dressing  applied. Disposition: Patient tolerated procedure well. Patient to return in 2 weeks for follow-up.   No follow-ups on file.
